# Patient Record
Sex: Female | Born: 1975 | Race: Black or African American | Hispanic: No | Marital: Married | Smoking: Current every day smoker
Health system: Southern US, Community
[De-identification: ages and names within clinical notes are randomized; demographics above are authoritative.]

## PROBLEM LIST (undated history)

## (undated) DIAGNOSIS — G43909 Migraine, unspecified, not intractable, without status migrainosus: Secondary | ICD-10-CM

## (undated) DIAGNOSIS — F419 Anxiety disorder, unspecified: Secondary | ICD-10-CM

## (undated) DIAGNOSIS — J40 Bronchitis, not specified as acute or chronic: Secondary | ICD-10-CM

## (undated) DIAGNOSIS — L309 Dermatitis, unspecified: Secondary | ICD-10-CM

## (undated) HISTORY — PX: BREAST REDUCTION SURGERY: SHX8

---

## 2010-09-01 ENCOUNTER — Emergency Department (HOSPITAL_BASED_OUTPATIENT_CLINIC_OR_DEPARTMENT_OTHER)
Admission: EM | Admit: 2010-09-01 | Discharge: 2010-09-01 | Disposition: A | Discharge status: Home / Self Care | Payer: Medicaid Other | Attending: Emergency Medicine | Admitting: Emergency Medicine

## 2010-09-01 ENCOUNTER — Emergency Department (INDEPENDENT_AMBULATORY_CARE_PROVIDER_SITE_OTHER): Payer: Medicaid Other

## 2010-09-01 DIAGNOSIS — M79609 Pain in unspecified limb: Secondary | ICD-10-CM

## 2010-09-01 DIAGNOSIS — M25469 Effusion, unspecified knee: Secondary | ICD-10-CM

## 2010-09-01 DIAGNOSIS — M7989 Other specified soft tissue disorders: Secondary | ICD-10-CM | POA: Insufficient documentation

## 2010-09-01 DIAGNOSIS — M25569 Pain in unspecified knee: Secondary | ICD-10-CM

## 2011-01-12 ENCOUNTER — Ambulatory Visit (HOSPITAL_BASED_OUTPATIENT_CLINIC_OR_DEPARTMENT_OTHER)
Admission: RE | Admit: 2011-01-12 | Discharge: 2011-01-12 | Disposition: A | Discharge status: Home / Self Care | Payer: Self-pay | Source: Ambulatory Visit | Attending: Emergency Medicine | Admitting: Emergency Medicine

## 2011-01-12 ENCOUNTER — Emergency Department (HOSPITAL_BASED_OUTPATIENT_CLINIC_OR_DEPARTMENT_OTHER)
Admission: EM | Admit: 2011-01-12 | Discharge: 2011-01-12 | Disposition: A | Discharge status: Home / Self Care | Payer: Self-pay | Attending: Emergency Medicine | Admitting: Emergency Medicine

## 2011-01-12 ENCOUNTER — Other Ambulatory Visit: Payer: Self-pay

## 2011-01-12 DIAGNOSIS — R079 Chest pain, unspecified: Secondary | ICD-10-CM | POA: Insufficient documentation

## 2011-01-12 DIAGNOSIS — R61 Generalized hyperhidrosis: Secondary | ICD-10-CM | POA: Insufficient documentation

## 2011-01-12 DIAGNOSIS — J40 Bronchitis, not specified as acute or chronic: Secondary | ICD-10-CM | POA: Insufficient documentation

## 2011-01-12 DIAGNOSIS — R05 Cough: Secondary | ICD-10-CM | POA: Insufficient documentation

## 2011-01-12 DIAGNOSIS — R059 Cough, unspecified: Secondary | ICD-10-CM | POA: Insufficient documentation

## 2011-01-12 DIAGNOSIS — K219 Gastro-esophageal reflux disease without esophagitis: Secondary | ICD-10-CM | POA: Insufficient documentation

## 2011-01-12 LAB — COMPREHENSIVE METABOLIC PANEL
ALT: 16 U/L (ref 0–35)
AST: 18 U/L (ref 0–37)
Albumin: 3.5 g/dL (ref 3.5–5.2)
Alkaline Phosphatase: 89 U/L (ref 39–117)
BUN: 14 mg/dL (ref 6–23)
CO2: 22 mEq/L (ref 19–32)
Calcium: 9.3 mg/dL (ref 8.4–10.5)
Chloride: 104 mEq/L (ref 96–112)
Creatinine, Ser: 0.9 mg/dL (ref 0.50–1.10)
GFR calc Af Amer: >60 mL/min (ref >60)
GFR calc non Af Amer: >60 mL/min (ref >60)
Glucose, Bld: 90 mg/dL (ref 70–99)
Potassium: 4.7 mEq/L (ref 3.5–5.1)
Sodium: 138 mEq/L (ref 135–145)
Total Bilirubin: 0.3 mg/dL (ref 0.3–1.2)
Total Protein: 7.8 g/dL (ref 6.0–8.3)

## 2011-01-12 LAB — CBC
HCT: 32.3 % — ABNORMAL LOW (ref 36.0–46.0)
Hemoglobin: 10.3 g/dL — ABNORMAL LOW (ref 12.0–15.0)
MCH: 25.1 pg — ABNORMAL LOW (ref 26.0–34.0)
MCHC: 31.9 g/dL (ref 30.0–36.0)
MCV: 78.6 fL (ref 78.0–100.0)
Platelets: 240 10*3/uL (ref 150–400)
RBC: 4.11 MIL/uL (ref 3.87–5.11)
RDW: 15.1 % (ref 11.5–15.5)
WBC: 4.2 10*3/uL (ref 4.0–10.5)

## 2011-01-12 LAB — AMYLASE: Amylase: 84 U/L (ref 0–105)

## 2011-01-12 LAB — LIPASE, BLOOD: Lipase: 32 U/L (ref 11–59)

## 2011-01-12 LAB — TROPONIN I: Troponin I: <0.3 ng/mL (ref <0.30)

## 2011-01-12 MED ORDER — KETOROLAC TROMETHAMINE 30 MG/ML IJ SOLN
30.0000 mg | Freq: Once | INTRAMUSCULAR | Status: AC
  Administered 2011-01-12: 30 mg via INTRAVENOUS
  Filled 2011-01-12: qty 1

## 2011-01-12 MED FILL — Alum & Mag Hydroxide-Simethicone Susp 200-200-20 MG/5ML: ORAL | Qty: 30 | Status: AC

## 2011-01-12 NOTE — ED Notes (Signed)
Pt. Reports she is feeling better with no resp. Distress noted.

## 2011-01-13 MED FILL — Alum & Mag Hydroxide-Simethicone Susp 200-200-20 MG/5ML: ORAL | Qty: 30 | Status: CN

## 2011-02-04 NOTE — ED Provider Notes (Signed)
History     CSN: [id number] Arrival date & time: 01/12/2011 11:44 AM  No chief complaint on file.   (Consider location/radiation/quality/duration/timing/severity/associated sxs/prior treatment) Patient is a 35 y.o. female presenting with chest pain. The history is provided by the patient. No language interpreter was used.  Chest Pain The chest pain began 6 - 12 hours ago. Chest pain occurs constantly. The chest pain is unchanged. The pain is associated with breathing and eating. At its most intense, the pain is at 10/10. The pain is currently at 7/10. The quality of the pain is described as burning and sharp. The pain radiates to the epigastrium. Chest pain is worsened by certain positions, exertion and eating. She tried nothing for the symptoms. Risk factors include no known risk factors.  Her family medical history is significant for CAD in family and diabetes in family.     No past medical history on file.  No past surgical history on file.  No family history on file.  History  Substance Use Topics  . Smoking status: Not on file  . Smokeless tobacco: Not on file  . Alcohol Use: Not on file    OB History    No data available      Review of Systems  Constitutional: Negative.   HENT: Negative.   Eyes: Negative.   Respiratory: Negative.   Cardiovascular: Positive for chest pain.  Gastrointestinal: Negative.   Genitourinary: Negative.   Musculoskeletal: Negative.   Skin: Negative.   Neurological: Negative.   Hematological: Negative.   Psychiatric/Behavioral: Negative.   All other systems reviewed and are negative.    Allergies  Review of patient's allergies indicates not on file.  Home Medications  No current outpatient prescriptions on file.  BP 116/69  Pulse 80  SpO2 99%  Physical Exam  Nursing note and vitals reviewed. Constitutional: She is oriented to person, place, and time. She appears well-developed and well-nourished. No distress.  HENT:  Head:  Normocephalic and atraumatic.  Eyes: Conjunctivae and EOM are normal. Pupils are equal, round, and reactive to light.  Neck: Normal range of motion.  Cardiovascular: Normal rate, regular rhythm, normal heart sounds and intact distal pulses.  Exam reveals no gallop and no friction rub.   No murmur heard. Pulmonary/Chest: Effort normal and breath sounds normal. No respiratory distress. She has no wheezes. She has no rales.  Abdominal: Soft. Bowel sounds are normal. She exhibits no distension. There is no tenderness. There is no rebound and no guarding.  Musculoskeletal: Normal range of motion. She exhibits no edema.  Neurological: She is alert and oriented to person, place, and time. No cranial nerve deficit. She exhibits normal muscle tone. Coordination normal.  Skin: Skin is warm.  Psychiatric: She has a normal mood and affect.    ED Course  Procedures (including critical care time)   Date: 01/12/2011  Rate: 88  Rhythm: normal sinus rhythm  QRS Axis: normal  Intervals: normal  ST/T Wave abnormalities: normal  Conduction Disutrbances:none  Narrative Interpretation: Normal EKG  Old EKG Reviewed: none available   Labs Reviewed  CBC - Abnormal; Notable for the following:    Hemoglobin 10.3 (*)    HCT 32.3 (*)    MCH 25.1 (*)    All other components within normal limits  AMYLASE  COMPREHENSIVE METABOLIC PANEL  LIPASE, BLOOD  TROPONIN I   No results found.   1. Chest pain, unspecified       MDM  Patient was evaluated for her chest pain.  She was given in a medically stable and in no acute distress. Patient was given toward all as well as a GI cocktail. With this the patient felt much better.  Lipase liver function troponin chest x-ray and EKG were all within normal limits. Patient did have a slight anemia. She does report having heavy menstrual periods. Her symptoms resolved in spite of this. Patient was told that she should followup with her primary care provider. She was  discharged home with Pepcid and told to return for other emergent concerns. Patient was comfortable the plan at discharge was discharged home in good condition.        Cyndra Numbers, MD 02/04/11 [phone]

## 2011-09-29 ENCOUNTER — Emergency Department (HOSPITAL_COMMUNITY)
Admission: EM | Admit: 2011-09-29 | Discharge: 2011-09-29 | Disposition: A | Discharge status: Home / Self Care | Payer: Medicaid Other | Attending: Emergency Medicine | Admitting: Emergency Medicine

## 2011-09-29 ENCOUNTER — Encounter (HOSPITAL_COMMUNITY): Payer: Self-pay | Admitting: Emergency Medicine

## 2011-09-29 DIAGNOSIS — G43909 Migraine, unspecified, not intractable, without status migrainosus: Secondary | ICD-10-CM

## 2011-09-29 HISTORY — DX: Migraine, unspecified, not intractable, without status migrainosus: G43.909

## 2011-09-29 MED ORDER — KETOROLAC TROMETHAMINE 30 MG/ML IJ SOLN
30.0000 mg | Freq: Once | INTRAMUSCULAR | Status: AC
  Administered 2011-09-29: 30 mg via INTRAVENOUS
  Filled 2011-09-29: qty 1

## 2011-09-29 MED ORDER — METOCLOPRAMIDE HCL 5 MG/ML IJ SOLN
10.0000 mg | Freq: Once | INTRAMUSCULAR | Status: AC
  Administered 2011-09-29: 10 mg via INTRAVENOUS
  Filled 2011-09-29: qty 2

## 2011-09-29 MED ORDER — SODIUM CHLORIDE 0.9 % IV BOLUS (SEPSIS)
1000.0000 mL | Freq: Once | INTRAVENOUS | Status: AC
  Administered 2011-09-29: 1000 mL via INTRAVENOUS

## 2011-09-29 MED ORDER — KETOROLAC TROMETHAMINE 10 MG PO TABS: 10.0000 mg | ORAL_TABLET | Freq: Four times a day (QID) | ORAL | Status: AC | PRN

## 2011-09-29 MED ORDER — DIPHENHYDRAMINE HCL 50 MG/ML IJ SOLN
25.0000 mg | Freq: Once | INTRAMUSCULAR | Status: AC
  Administered 2011-09-29: 25 mg via INTRAVENOUS
  Filled 2011-09-29: qty 1

## 2011-09-29 NOTE — ED Notes (Signed)
Pt claimed that she feels much better.

## 2011-09-29 NOTE — ED Notes (Signed)
Onset of migraine with history 3 days ago with light sensitivity pain 10/10 throbbing. States developed bilateral hand numbness and nausea after headache.  Today while at work light sensitivity and felt like walls were closing in.   Ax4 clear speech answering and following commands appropriate.

## 2011-09-29 NOTE — Discharge Instructions (Signed)
Return here as needed for any worsening in your condition. Increase your fluid intake. Follow up with your doctor for a recheck.

## 2011-09-29 NOTE — ED Provider Notes (Signed)
History     CSN: [id number]  Arrival date & time 09/29/11  1104   First MD Initiated Contact with Patient 09/29/11 1151      Chief Complaint  Patient presents with  . Migraine    (Consider location/radiation/quality/duration/timing/severity/associated sxs/prior treatment) HPI Patient presents emergency Dept. with a three-day history of migraine headache.  Patient states light makes her headache, worse.  Patient states that she has a migraine history, but has not had one in about a year.  Patient denies any visual changes, vomiting, chest pain, shortness of breath, fever, or weakness.  Patient, states that she tried ibuprofen without relief. Past Medical History  Diagnosis Date  . Migraine     Past Surgical History  Procedure Date  . Breast reduction surgery     No family history on file.  History  Substance Use Topics  . Smoking status: Current Everyday Smoker  . Smokeless tobacco: Not on file  . Alcohol Use: Yes    OB History    Grav Para Term Preterm Abortions TAB SAB Ect Mult Living                  Review of Systems All other systems negative except as documented in the HPI. All pertinent positives and negatives as reviewed in the HPI.  Allergies  Other  Home Medications   Current Outpatient Rx  Name Route Sig Dispense Refill  . ACETAMINOPHEN 500 MG PO TABS Oral Take 1,000 mg by mouth every 6 (six) hours as needed. For pain    . BUSPIRONE HCL 15 MG PO TABS Oral Take 7.5 mg by mouth 2 (two) times daily.    . CYCLOBENZAPRINE HCL 10 MG PO TABS Oral Take 10 mg by mouth at bedtime.    . IBUPROFEN 200 MG PO TABS Oral Take 400 mg by mouth every 6 (six) hours as needed. For pain.    Marland Kitchen NABUMETONE 750 MG PO TABS Oral Take 750 mg by mouth 2 (two) times daily as needed. For inflammation    . TRAMADOL HCL 50 MG PO TABS Oral Take 50 mg by mouth every 6 (six) hours as needed. For pain.      BP 121/82  Pulse 84  Temp(Src) 98.4 F (36.9 C) (Oral)  Resp 16  SpO2  100%  Physical Exam  Nursing note and vitals reviewed. Constitutional: She is oriented to person, place, and time. She appears well-developed and well-nourished.  HENT:  Head: Normocephalic and atraumatic.  Mouth/Throat: Oropharynx is clear and moist.  Eyes: EOM are normal. Pupils are equal, round, and reactive to light.  Cardiovascular: Normal rate, regular rhythm and normal heart sounds.   Pulmonary/Chest: Effort normal and breath sounds normal.  Neurological: She is alert and oriented to person, place, and time. She has normal strength. No sensory deficit. Coordination and gait normal. GCS eye subscore is 4. GCS verbal subscore is 5. GCS motor subscore is 6.  Skin: Skin is warm and dry.    ED Course  Procedures (including critical care time)  Patient has no neurological deficits noted on exam.  Patient will be given IV fluids and a migraine cocktail, consisting of Toradol, Reglan, and Benadryl.  Patient be reevaluated.  2:01 PM recheck the patient and she is completely resolved of her headache at this time MDM          Carlyle Dolly, PA-C 09/29/11 1401

## 2011-09-29 NOTE — ED Provider Notes (Signed)
Medical screening examination/treatment/procedure(s) were performed by non-physician practitioner and as supervising physician I was immediately available for consultation/collaboration.   Laray Anger, DO 09/29/11 1531

## 2011-10-11 ENCOUNTER — Encounter (HOSPITAL_COMMUNITY): Payer: Self-pay

## 2011-10-11 ENCOUNTER — Emergency Department (HOSPITAL_COMMUNITY): Payer: Medicaid Other

## 2011-10-11 ENCOUNTER — Emergency Department (HOSPITAL_COMMUNITY)
Admission: EM | Admit: 2011-10-11 | Discharge: 2011-10-11 | Disposition: A | Discharge status: Home / Self Care | Payer: Medicaid Other | Attending: Emergency Medicine | Admitting: Emergency Medicine

## 2011-10-11 DIAGNOSIS — F17200 Nicotine dependence, unspecified, uncomplicated: Secondary | ICD-10-CM | POA: Insufficient documentation

## 2011-10-11 DIAGNOSIS — IMO0002 Reserved for concepts with insufficient information to code with codable children: Secondary | ICD-10-CM | POA: Insufficient documentation

## 2011-10-11 DIAGNOSIS — Y92009 Unspecified place in unspecified non-institutional (private) residence as the place of occurrence of the external cause: Secondary | ICD-10-CM | POA: Insufficient documentation

## 2011-10-11 DIAGNOSIS — S6990XA Unspecified injury of unspecified wrist, hand and finger(s), initial encounter: Secondary | ICD-10-CM | POA: Insufficient documentation

## 2011-10-11 DIAGNOSIS — Z79899 Other long term (current) drug therapy: Secondary | ICD-10-CM | POA: Insufficient documentation

## 2011-10-11 DIAGNOSIS — S6991XA Unspecified injury of right wrist, hand and finger(s), initial encounter: Secondary | ICD-10-CM

## 2011-10-11 MED ORDER — HYDROCODONE-ACETAMINOPHEN 5-325 MG PO TABS
1.0000 | ORAL_TABLET | Freq: Once | ORAL | Status: AC
  Administered 2011-10-11: 1 via ORAL
  Filled 2011-10-11: qty 1

## 2011-10-11 NOTE — ED Provider Notes (Signed)
History     CSN: [id number]  Arrival date & time 10/11/11  4098   First MD Initiated Contact with Patient 10/11/11 [phone]      Chief Complaint  Patient presents with  . Hand Pain    (Consider location/radiation/quality/duration/timing/severity/associated sxs/prior treatment) The history is provided by the patient.   36 year old female with a history of only migraine headaches is right-hand dominant presents the emergency department with a chief complaint of persistent right hand pain after she hit her hand on a door at home 2 days ago. There is associated swelling and decreased range of motion as well as some color change. She denies any numbness to the hand. Pain worse with attempted movement, palpation of the hands. No alleviating factors. Has taken Relafen without relief.  Past Medical History  Diagnosis Date  . Migraine     Past Surgical History  Procedure Date  . Breast reduction surgery     No family history on file.  History  Substance Use Topics  . Smoking status: Current Everyday Smoker  . Smokeless tobacco: Not on file  . Alcohol Use: Yes     Review of Systems  Musculoskeletal:       See HPI  Skin: Positive for color change. Negative for wound.  Neurological: Positive for weakness. Negative for numbness.    Allergies  Other  Home Medications   Current Outpatient Rx  Name Route Sig Dispense Refill  . ACETAMINOPHEN 500 MG PO TABS Oral Take 1,000 mg by mouth every 6 (six) hours as needed. For pain    . BUSPIRONE HCL 15 MG PO TABS Oral Take 7.5 mg by mouth 2 (two) times daily.    . CYCLOBENZAPRINE HCL 10 MG PO TABS Oral Take 10 mg by mouth at bedtime.    . IBUPROFEN 200 MG PO TABS Oral Take 400 mg by mouth every 6 (six) hours as needed. For pain.    Marland Kitchen KETOROLAC TROMETHAMINE 10 MG PO TABS Oral Take 10 mg by mouth every 6 (six) hours as needed. For pain.    Marland Kitchen NABUMETONE 750 MG PO TABS Oral Take 750 mg by mouth 2 (two) times daily as needed. For inflammation     . TRAMADOL HCL 50 MG PO TABS Oral Take 50 mg by mouth every 6 (six) hours as needed. For pain.      BP 125/81  Pulse 80  Temp 98.2 F (36.8 C) (Oral)  Resp 14  SpO2 100%  LMP 10/04/2011  Physical Exam  Nursing note reviewed. Constitutional: She appears well-developed and well-nourished.       Vital signs are reviewed and are normal. unComfortable-appearing  HENT:  Head: Normocephalic.  Eyes: Conjunctivae are normal.  Neck: Neck supple.  Cardiovascular: Normal rate.   Pulmonary/Chest: No respiratory distress.  Musculoskeletal: She exhibits edema and tenderness.       Right wrist: Normal.       Right hand: She exhibits decreased range of motion, tenderness, bony tenderness and deformity. She exhibits normal capillary refill and no laceration. normal sensation noted. Decreased strength noted. She exhibits finger abduction. She exhibits no thumb/finger opposition and no wrist extension trouble.       To the right hand, there is pain to palpation over the second digit PIP joint, third digit MCP joint and PIP joint, fourth digit MCP joint, and over the entire body of the third and fourth metacarpals. Due to pain, patient is not able to flex the digits for evaluation of alignment. Distal NVI.  Neurological: She is alert.  Skin: Skin is warm and dry.    ED Course  Procedures (including critical care time)  Labs Reviewed - No data to display Dg Hand Complete Right  10/11/2011  *RADIOLOGY REPORT*  Clinical Data: Pain and swelling about the third metacarpal since an injury 3 days ago.  RIGHT HAND - COMPLETE 3+ VIEW  Comparison: None.  Findings: Soft tissues appear swollen on the lateral view.  No fracture or dislocation.  IMPRESSION: Soft tissue swelling without underlying bony or joint abnormality.  Original Report Authenticated By: Bernadene Bell. D'ALESSIO, M.D.     Dx 1: Right hand injury   MDM  Hand injury. Extremity is imaged, I personally reviewed the images, reviewed the  radiologist's interpretation. No fracture. Results discussed with patient. She will apply ice and continue taking NSAID for pain.        Shaaron Adler, PA-C 10/11/11 1001

## 2011-10-11 NOTE — ED Notes (Signed)
Pt states she hit right hand on door at home on Saturday and has had pain since.  Pt has taken ibuprofen with no relief.  Edema noted to right hand.

## 2011-10-11 NOTE — Discharge Instructions (Signed)
As discussed, there were no broken bones seen in your hand on the x-ray. Apply ice to your injured areas for 15-20 minutes three times a day for the next several days to help reduce swelling and inflammation. You should take ibuprofen or your Relafen as you have been doing to reduce inflammation. Take your tramadol as needed for more severe pain. If your pain does not improve as we discussed, call your primary Dr. to see if they are comfortable following up on this issue. If not, the phone number for the hand doctor is listed above.     Hand Injuries Minor fractures, sprains, bruises and burns of the hand are often managed in much the same way:  Elevation of your hand above the level of your heart for a few days after the injury, until the pain and swelling improve.   The use of hand dressings and splints to reduce motion, relieve painand prevent reinjury. The dressing and splint should not be removed without the caregiver's approval.   Application of ice packs for 20 to 30 minutes every few hours for 2 to 3 days to reduce pain and swelling due to fractures, sprains, and deep bruises.   The use of medicine to reduce pain and inflammation.  Early motion exercises are sometimes needed to reduce joint stiffness after a hand injury. However, your hand should not be used for any activities that increase pain. Document Released: 05/20/2004 Document Revised: 04/01/2011 Document Reviewed: 07/12/2008 [facility] Patient Information 2012 [place], [place].

## 2011-10-11 NOTE — ED Notes (Signed)
Waiting for D/C papers. 

## 2011-10-12 NOTE — ED Provider Notes (Signed)
Medical screening examination/treatment/procedure(s) were performed by non-physician practitioner and as supervising physician I was immediately available for consultation/collaboration.   Kianah Harries, MD 10/12/11 0821 

## 2011-10-19 ENCOUNTER — Ambulatory Visit: Payer: Medicaid Other | Attending: Orthopedic Surgery | Admitting: Occupational Therapy

## 2011-10-19 DIAGNOSIS — IMO0001 Reserved for inherently not codable concepts without codable children: Secondary | ICD-10-CM | POA: Insufficient documentation

## 2011-10-19 DIAGNOSIS — M25549 Pain in joints of unspecified hand: Secondary | ICD-10-CM | POA: Insufficient documentation

## 2011-10-19 DIAGNOSIS — R609 Edema, unspecified: Secondary | ICD-10-CM | POA: Insufficient documentation

## 2012-09-15 ENCOUNTER — Encounter (HOSPITAL_COMMUNITY): Payer: Self-pay

## 2012-09-15 ENCOUNTER — Emergency Department (HOSPITAL_COMMUNITY)
Admission: EM | Admit: 2012-09-15 | Discharge: 2012-09-15 | Disposition: A | Discharge status: Home / Self Care | Payer: Medicaid Other | Attending: Emergency Medicine | Admitting: Emergency Medicine

## 2012-09-15 DIAGNOSIS — Z79899 Other long term (current) drug therapy: Secondary | ICD-10-CM | POA: Insufficient documentation

## 2012-09-15 DIAGNOSIS — R059 Cough, unspecified: Secondary | ICD-10-CM | POA: Insufficient documentation

## 2012-09-15 DIAGNOSIS — F411 Generalized anxiety disorder: Secondary | ICD-10-CM | POA: Insufficient documentation

## 2012-09-15 DIAGNOSIS — R1310 Dysphagia, unspecified: Secondary | ICD-10-CM | POA: Insufficient documentation

## 2012-09-15 DIAGNOSIS — F17200 Nicotine dependence, unspecified, uncomplicated: Secondary | ICD-10-CM | POA: Insufficient documentation

## 2012-09-15 DIAGNOSIS — R05 Cough: Secondary | ICD-10-CM | POA: Insufficient documentation

## 2012-09-15 DIAGNOSIS — J029 Acute pharyngitis, unspecified: Secondary | ICD-10-CM | POA: Insufficient documentation

## 2012-09-15 DIAGNOSIS — Z8679 Personal history of other diseases of the circulatory system: Secondary | ICD-10-CM | POA: Insufficient documentation

## 2012-09-15 HISTORY — DX: Anxiety disorder, unspecified: F41.9

## 2012-09-15 LAB — RAPID STREP SCREEN (MED CTR MEBANE ONLY): Streptococcus, Group A Screen (Direct): NEGATIVE

## 2012-09-15 MED ORDER — TRAMADOL HCL 50 MG PO TABS: 50.0000 mg | ORAL_TABLET | Freq: Four times a day (QID) | ORAL | Status: DC | PRN

## 2012-09-15 MED ORDER — DEXAMETHASONE SODIUM PHOSPHATE 10 MG/ML IJ SOLN
10.0000 mg | Freq: Once | INTRAMUSCULAR | Status: AC
  Administered 2012-09-15: 10 mg via INTRAMUSCULAR
  Filled 2012-09-15: qty 1

## 2012-09-15 MED ORDER — LIDOCAINE VISCOUS 2 % MT SOLN
20.0000 mL | Freq: Once | OROMUCOSAL | Status: AC
  Administered 2012-09-15: 20 mL via OROMUCOSAL
  Filled 2012-09-15: qty 15

## 2012-09-15 MED ORDER — LIDOCAINE VISCOUS 2 % MT SOLN
15.0000 mL | OROMUCOSAL | Status: DC | PRN
Start: 2012-09-15 — End: 2013-04-06

## 2012-09-15 NOTE — ED Notes (Signed)
Pt reports sore throat x 2 days. Recently exposed to strep on Monday.

## 2012-09-15 NOTE — ED Provider Notes (Signed)
History    This chart was scribed for a non-physician practitioner, Wynetta Emery, working with Hurman Horn, MD by Frederik Pear, ED Scribe. This patient was seen in room TR09C/TR09C and the patient's care was started at 1820.   CSN: [id number]  Arrival date & time 09/15/12  1735   First MD Initiated Contact with Patient 09/15/12 1820      Chief Complaint  Patient presents with  . Sore Throat    (Consider location/radiation/quality/duration/timing/severity/associated sxs/prior treatment) The history is provided by the patient and medical records. No language interpreter was used.    HPI Comments: Pamela Lewis is a 37 y.o. female who presents to the Emergency Department complaining of sudden onset, constant, gradually worsening sore throat that is worse on the right than the left and began 2 days ago with associated intermittent cough and difficulty swallowing. She denies fever. She states that she works in a [employer] and was exposed to strep throat 4 days ago. She has treated the symptoms with alternating extra strength ibuprofen and tylenol since yesterday with no relief.  PCP is Dr. Lerry Liner.   Past Medical History  Diagnosis Date  . Migraine   . Anxiety     Past Surgical History  Procedure Laterality Date  . Breast reduction surgery      No family history on file.  History  Substance Use Topics  . Smoking status: Current Every Day Smoker  . Smokeless tobacco: Not on file  . Alcohol Use: Yes    OB History   Grav Para Term Preterm Abortions TAB SAB Ect Mult Living                  Review of Systems  Constitutional: Negative for fever.  HENT: Positive for sore throat and trouble swallowing.   Respiratory: Positive for cough.   Gastrointestinal: Negative for nausea, vomiting and diarrhea.  All other systems reviewed and are negative.   Allergies  Other  Home Medications   Current Outpatient Rx  Name  Route  Sig  Dispense  Refill  .  acetaminophen (TYLENOL) 500 MG tablet   Oral   Take 1,000 mg by mouth every 6 (six) hours as needed. For pain         . busPIRone (BUSPAR) 15 MG tablet   Oral   Take 7.5 mg by mouth daily.          Marland Kitchen ibuprofen (ADVIL,MOTRIN) 200 MG tablet   Oral   Take 400 mg by mouth every 6 (six) hours as needed for pain. For pain.           BP 126/82  Pulse 75  Temp(Src) 98.6 F (37 C) (Oral)  SpO2 98%  LMP 09/15/2012  Physical Exam  Nursing note and vitals reviewed. Constitutional: She is oriented to person, place, and time. She appears well-developed and well-nourished. No distress.  HENT:  Head: Normocephalic.  Mouth/Throat: Uvula is midline. No oropharyngeal exudate, posterior oropharyngeal edema, posterior oropharyngeal erythema or tonsillar abscesses.  Eyes: Conjunctivae and EOM are normal.  Cardiovascular: Normal rate.   Pulmonary/Chest: Effort normal. No stridor.  Abdominal: Soft. Bowel sounds are normal.  Musculoskeletal: Normal range of motion.  Lymphadenopathy:    She has no cervical adenopathy.  No anterior cervical lymphadenopathy.  Neurological: She is alert and oriented to person, place, and time.  Psychiatric: She has a normal mood and affect.    ED Course  Procedures (including critical care time)  DIAGNOSTIC STUDIES: Oxygen Saturation is  98% on room air, normal by my interpretation.    COORDINATION OF CARE:  19:16- Discussed planned course of treatment with the patient, including decadron and Xylocaine, who is agreeable at this time.  19:30- Medication Orders- dexamethasone (decadron) injection 10 mg- once, lidocaine (xylocaine) 2% viscous mouth solution 20 mL-once.  Results for orders placed during the hospital encounter of 09/15/12  RAPID STREP SCREEN      Result Value Range   Streptococcus, Group A Screen (Direct) NEGATIVE  NEGATIVE   Labs Reviewed  RAPID STREP SCREEN  CULTURE, GROUP A STREP   No results found.   1. Acute pharyngitis        MDM   Filed Vitals:   09/15/12 1814 09/15/12 2002  BP: 126/82 118/80  Pulse: 75 75  Temp: 98.6 F (37 C)   TempSrc: Oral   SpO2: 98% 100%     Pamela Lewis is a 37 y.o. female sore throat, physical exam is not consistent with strep. This is a viral pharyngitis. I have given the patient reassurance and given her symptomatic relief.   Medications  dexamethasone (DECADRON) injection 10 mg (10 mg Intramuscular Given 09/15/12 1956)  lidocaine (XYLOCAINE) 2 % viscous mouth solution 20 mL (20 mLs Mouth/Throat Given 09/15/12 1958)    The patient is hemodynamically stable, appropriate for, and amenable to, discharge at this time. Pt verbalized understanding and agrees with care plan. Outpatient follow-up and return precautions given.    Discharge Medication List as of 09/15/2012  7:21 PM    START taking these medications   Details  lidocaine (XYLOCAINE) 2 % solution Take 15 mLs by mouth every 3 (three) hours as needed for pain., Starting 09/15/2012, Until Discontinued, Print    traMADol (ULTRAM) 50 MG tablet Take 1 tablet (50 mg total) by mouth every 6 (six) hours as needed for pain., Starting 09/15/2012, Until Discontinued, Print        I personally performed the services described in this documentation, which was scribed in my presence. The recorded information has been reviewed and is accurate.          Wynetta Emery, PA-C 09/17/12 2134

## 2012-09-17 LAB — CULTURE, GROUP A STREP

## 2012-09-18 NOTE — ED Provider Notes (Signed)
Medical screening examination/treatment/procedure(s) were performed by non-physician practitioner and as supervising physician I was immediately available for consultation/collaboration.   Hurman Horn, MD 09/18/12 [phone]

## 2013-04-06 ENCOUNTER — Encounter (HOSPITAL_COMMUNITY): Admission: EM | Disposition: A | Discharge status: Home / Self Care | Payer: Self-pay | Source: Home / Self Care

## 2013-04-06 ENCOUNTER — Inpatient Hospital Stay (HOSPITAL_COMMUNITY)
Admission: EM | Admit: 2013-04-06 | Discharge: 2013-04-08 | DRG: 601 | Disposition: A | Discharge status: Home / Self Care | Payer: Medicaid Other | Attending: Surgery | Admitting: Surgery

## 2013-04-06 ENCOUNTER — Encounter (HOSPITAL_COMMUNITY): Payer: Self-pay | Admitting: Emergency Medicine

## 2013-04-06 ENCOUNTER — Emergency Department (HOSPITAL_COMMUNITY): Payer: Medicaid Other | Admitting: Certified Registered Nurse Anesthetist

## 2013-04-06 ENCOUNTER — Encounter (HOSPITAL_COMMUNITY): Payer: Medicaid Other | Admitting: Certified Registered Nurse Anesthetist

## 2013-04-06 DIAGNOSIS — Z803 Family history of malignant neoplasm of breast: Secondary | ICD-10-CM

## 2013-04-06 DIAGNOSIS — Z79899 Other long term (current) drug therapy: Secondary | ICD-10-CM

## 2013-04-06 DIAGNOSIS — N61 Mastitis without abscess: Principal | ICD-10-CM | POA: Diagnosis present

## 2013-04-06 DIAGNOSIS — F411 Generalized anxiety disorder: Secondary | ICD-10-CM | POA: Diagnosis present

## 2013-04-06 DIAGNOSIS — N611 Abscess of the breast and nipple: Secondary | ICD-10-CM

## 2013-04-06 DIAGNOSIS — F17200 Nicotine dependence, unspecified, uncomplicated: Secondary | ICD-10-CM | POA: Diagnosis present

## 2013-04-06 HISTORY — PX: IRRIGATION AND DEBRIDEMENT ABSCESS: SHX5252

## 2013-04-06 LAB — BASIC METABOLIC PANEL
BUN: 12 mg/dL (ref 6–23)
CO2: 24 mEq/L (ref 19–32)
Calcium: 8.7 mg/dL (ref 8.4–10.5)
Chloride: 101 mEq/L (ref 96–112)
Creatinine, Ser: 0.85 mg/dL (ref 0.50–1.10)
GFR calc Af Amer: >90 mL/min (ref >90)
GFR calc non Af Amer: 85 mL/min — ABNORMAL LOW (ref >90)
Glucose, Bld: 88 mg/dL (ref 70–99)
Potassium: 4.3 mEq/L (ref 3.5–5.1)
Sodium: 134 mEq/L — ABNORMAL LOW (ref 135–145)

## 2013-04-06 LAB — CBC WITH DIFFERENTIAL/PLATELET
Basophils Absolute: 0 10*3/uL (ref 0.0–0.1)
Basophils Relative: 0 % (ref 0–1)
Eosinophils Absolute: 0.3 10*3/uL (ref 0.0–0.7)
Eosinophils Relative: 3 % (ref 0–5)
HCT: 35.2 % — ABNORMAL LOW (ref 36.0–46.0)
Hemoglobin: 11.4 g/dL — ABNORMAL LOW (ref 12.0–15.0)
Lymphocytes Relative: 25 % (ref 12–46)
Lymphs Abs: 2 10*3/uL (ref 0.7–4.0)
MCH: 28 pg (ref 26.0–34.0)
MCHC: 32.4 g/dL (ref 30.0–36.0)
MCV: 86.5 fL (ref 78.0–100.0)
Monocytes Absolute: 0.6 10*3/uL (ref 0.1–1.0)
Monocytes Relative: 8 % (ref 3–12)
Neutro Abs: 5 10*3/uL (ref 1.7–7.7)
Neutrophils Relative %: 63 % (ref 43–77)
Platelets: 220 10*3/uL (ref 150–400)
RBC: 4.07 MIL/uL (ref 3.87–5.11)
RDW: 14.8 % (ref 11.5–15.5)
WBC: 7.9 10*3/uL (ref 4.0–10.5)

## 2013-04-06 SURGERY — IRRIGATION AND DEBRIDEMENT ABSCESS
Anesthesia: General | Site: Breast | Laterality: Right

## 2013-04-06 MED ORDER — BUSPIRONE HCL 15 MG PO TABS
7.5000 mg | ORAL_TABLET | Freq: Every day | ORAL | Status: DC
  Administered 2013-04-07 – 2013-04-08 (×2): 7.5 mg via ORAL
  Filled 2013-04-06 (×2): qty 1

## 2013-04-06 MED ORDER — HYDROMORPHONE HCL PF 1 MG/ML IJ SOLN: 1.0000 mg | Freq: Once | INTRAMUSCULAR | Status: DC

## 2013-04-06 MED ORDER — MIDAZOLAM HCL 5 MG/5ML IJ SOLN
INTRAMUSCULAR | Status: DC | PRN
  Administered 2013-04-06: 2 mg via INTRAVENOUS

## 2013-04-06 MED ORDER — 0.9 % SODIUM CHLORIDE (POUR BTL) OPTIME
TOPICAL | Status: DC | PRN
  Administered 2013-04-06: 1000 mL

## 2013-04-06 MED ORDER — OXYCODONE-ACETAMINOPHEN 5-325 MG PO TABS
1.0000 | ORAL_TABLET | ORAL | Status: DC | PRN
  Administered 2013-04-07 – 2013-04-08 (×5): 2 via ORAL
  Filled 2013-04-06 (×5): qty 2

## 2013-04-06 MED ORDER — OXYCODONE HCL 5 MG/5ML PO SOLN: 5.0000 mg | Freq: Once | ORAL | Status: DC | PRN

## 2013-04-06 MED ORDER — HYDROMORPHONE HCL PF 1 MG/ML IJ SOLN
1.0000 mg | INTRAMUSCULAR | Status: AC
  Administered 2013-04-06: 1 mg via INTRAVENOUS
  Filled 2013-04-06: qty 1

## 2013-04-06 MED ORDER — OXYCODONE HCL 5 MG PO TABS: 5.0000 mg | ORAL_TABLET | Freq: Once | ORAL | Status: DC | PRN

## 2013-04-06 MED ORDER — CEFAZOLIN SODIUM-DEXTROSE 2-3 GM-% IV SOLR
INTRAVENOUS | Status: DC | PRN
  Administered 2013-04-06: 2 g via INTRAVENOUS

## 2013-04-06 MED ORDER — LIDOCAINE HCL (CARDIAC) 20 MG/ML IV SOLN
INTRAVENOUS | Status: DC | PRN
  Administered 2013-04-06: 50 mg via INTRAVENOUS

## 2013-04-06 MED ORDER — ONDANSETRON HCL 4 MG/2ML IJ SOLN: 4.0000 mg | Freq: Four times a day (QID) | INTRAMUSCULAR | Status: DC | PRN

## 2013-04-06 MED ORDER — FENTANYL CITRATE 0.05 MG/ML IJ SOLN
INTRAMUSCULAR | Status: DC | PRN
  Administered 2013-04-06: 100 ug via INTRAVENOUS
  Administered 2013-04-06 (×3): 50 ug via INTRAVENOUS

## 2013-04-06 MED ORDER — POTASSIUM CHLORIDE IN NACL 20-0.9 MEQ/L-% IV SOLN
INTRAVENOUS | Status: DC
  Administered 2013-04-07: via INTRAVENOUS
  Filled 2013-04-06 (×4): qty 1000

## 2013-04-06 MED ORDER — MORPHINE SULFATE 2 MG/ML IJ SOLN: 2.0000 mg | INTRAMUSCULAR | Status: DC | PRN

## 2013-04-06 MED ORDER — LACTATED RINGERS IV SOLN
INTRAVENOUS | Status: DC | PRN
  Administered 2013-04-06: 21:00:00 via INTRAVENOUS

## 2013-04-06 MED ORDER — PROPOFOL 10 MG/ML IV BOLUS
INTRAVENOUS | Status: DC | PRN
  Administered 2013-04-06: 180 mg via INTRAVENOUS

## 2013-04-06 MED ORDER — HYDROMORPHONE HCL PF 1 MG/ML IJ SOLN
0.2500 mg | INTRAMUSCULAR | Status: DC | PRN
  Administered 2013-04-06 (×3): 0.5 mg via INTRAVENOUS

## 2013-04-06 MED ORDER — HYDROMORPHONE HCL PF 1 MG/ML IJ SOLN
INTRAMUSCULAR | Status: AC
  Filled 2013-04-06: qty 1

## 2013-04-06 MED ORDER — KETOROLAC TROMETHAMINE 15 MG/ML IJ SOLN
30.0000 mg | Freq: Four times a day (QID) | INTRAMUSCULAR | Status: DC
  Administered 2013-04-07 – 2013-04-08 (×6): 30 mg via INTRAVENOUS
  Filled 2013-04-06 (×11): qty 2

## 2013-04-06 MED ORDER — DEXTROSE 5 % IV SOLN
2.0000 g | Freq: Once | INTRAVENOUS | Status: DC
  Filled 2013-04-06: qty 20

## 2013-04-06 MED ORDER — SUCCINYLCHOLINE CHLORIDE 20 MG/ML IJ SOLN
INTRAMUSCULAR | Status: DC | PRN
  Administered 2013-04-06: 100 mg via INTRAVENOUS

## 2013-04-06 MED ORDER — ONDANSETRON HCL 4 MG PO TABS: 4.0000 mg | ORAL_TABLET | Freq: Four times a day (QID) | ORAL | Status: DC | PRN

## 2013-04-06 MED ORDER — ONDANSETRON HCL 4 MG/2ML IJ SOLN
INTRAMUSCULAR | Status: DC | PRN
  Administered 2013-04-06: 4 mg via INTRAVENOUS

## 2013-04-06 MED ORDER — CEFAZOLIN SODIUM 1-5 GM-% IV SOLN
1.0000 g | Freq: Three times a day (TID) | INTRAVENOUS | Status: DC
  Administered 2013-04-07 – 2013-04-08 (×4): 1 g via INTRAVENOUS
  Filled 2013-04-06 (×6): qty 50

## 2013-04-06 MED ORDER — ENOXAPARIN SODIUM 40 MG/0.4ML ~~LOC~~ SOLN
40.0000 mg | Freq: Every day | SUBCUTANEOUS | Status: DC
  Administered 2013-04-07: 40 mg via SUBCUTANEOUS
  Filled 2013-04-06 (×2): qty 0.4

## 2013-04-06 SURGICAL SUPPLY — 30 items
BLADE SURG 10 STRL SS (BLADE) ×2 IMPLANT
BLADE SURG ROTATE 9660 (MISCELLANEOUS) IMPLANT
CANISTER SUCTION 2500CC (MISCELLANEOUS) ×2 IMPLANT
COVER SURGICAL LIGHT HANDLE (MISCELLANEOUS) ×2 IMPLANT
DRAPE LAPAROTOMY TRNSV 102X78 (DRAPE) ×2 IMPLANT
DRAPE UTILITY 15X26 W/TAPE STR (DRAPE) ×4 IMPLANT
DRSG PAD ABDOMINAL 8X10 ST (GAUZE/BANDAGES/DRESSINGS) ×1 IMPLANT
ELECT CAUTERY BLADE 6.4 (BLADE) ×2 IMPLANT
ELECT REM PT RETURN 9FT ADLT (ELECTROSURGICAL) ×2
ELECTRODE REM PT RTRN 9FT ADLT (ELECTROSURGICAL) ×1 IMPLANT
GLOVE BIO SURGEON STRL SZ7 (GLOVE) ×2 IMPLANT
GLOVE BIOGEL PI IND STRL 7.5 (GLOVE) ×1 IMPLANT
GLOVE BIOGEL PI INDICATOR 7.5 (GLOVE) ×1
GOWN STRL NON-REIN LRG LVL3 (GOWN DISPOSABLE) ×4 IMPLANT
KIT BASIN OR (CUSTOM PROCEDURE TRAY) ×2 IMPLANT
KIT ROOM TURNOVER OR (KITS) ×2 IMPLANT
NS IRRIG 1000ML POUR BTL (IV SOLUTION) ×2 IMPLANT
PACK SURGICAL SETUP 50X90 (CUSTOM PROCEDURE TRAY) ×2 IMPLANT
PAD ARMBOARD 7.5X6 YLW CONV (MISCELLANEOUS) ×4 IMPLANT
PENCIL BUTTON HOLSTER BLD 10FT (ELECTRODE) ×2 IMPLANT
SPONGE GAUZE 4X4 12PLY (GAUZE/BANDAGES/DRESSINGS) ×2 IMPLANT
SPONGE LAP 18X18 X RAY DECT (DISPOSABLE) ×2 IMPLANT
SWAB COLLECTION DEVICE MRSA (MISCELLANEOUS) ×2 IMPLANT
SYR BULB IRRIGATION 50ML (SYRINGE) IMPLANT
TOWEL OR 17X24 6PK STRL BLUE (TOWEL DISPOSABLE) ×2 IMPLANT
TOWEL OR 17X26 10 PK STRL BLUE (TOWEL DISPOSABLE) ×2 IMPLANT
TUBE ANAEROBIC SPECIMEN COL (MISCELLANEOUS) ×2 IMPLANT
TUBE CONNECTING 12X1/4 (SUCTIONS) ×2 IMPLANT
WATER STERILE IRR 1000ML POUR (IV SOLUTION) ×1 IMPLANT
YANKAUER SUCT BULB TIP NO VENT (SUCTIONS) ×2 IMPLANT

## 2013-04-06 NOTE — Transfer of Care (Signed)
Immediate Anesthesia Transfer of Care Note  Patient: Pamela Lewis  Procedure(s) Performed: Procedure(s): IRRIGATION AND Drainage RIGHT BREAST ABSCESS (Right)  Patient Location: PACU  Anesthesia Type:General  Level of Consciousness: awake, alert , oriented and patient cooperative  Airway & Oxygen Therapy: Patient Spontanous Breathing and Patient connected to nasal cannula oxygen  Post-op Assessment: Report given to PACU RN and Post -op Vital signs reviewed and stable  Post vital signs: Reviewed and stable  Complications: No apparent anesthesia complications

## 2013-04-06 NOTE — Anesthesia Preprocedure Evaluation (Addendum)
Anesthesia Evaluation  Patient identified by MRN, date of birth, ID band Patient awake    Reviewed: Allergy & Precautions, H&P , NPO status , Patient's Chart, lab work & pertinent test results  Airway Mallampati: I TM Distance: >3 FB     Dental  (+) Teeth Intact and Dental Advisory Given   Pulmonary Current Smoker, former smoker,  breath sounds clear to auscultation        Cardiovascular Rhythm:Regular Rate:Normal     Neuro/Psych  Headaches, Anxiety    GI/Hepatic   Endo/Other    Renal/GU      Musculoskeletal   Abdominal   Peds  Hematology   Anesthesia Other Findings   Reproductive/Obstetrics                        Anesthesia Physical Anesthesia Plan  ASA: II and emergent  Anesthesia Plan: General   Post-op Pain Management:    Induction: Intravenous  Airway Management Planned: Oral ETT  Additional Equipment:   Intra-op Plan:   Post-operative Plan: Extubation in OR  Informed Consent:   Dental advisory given  Plan Discussed with: CRNA, Anesthesiologist and Surgeon  Anesthesia Plan Comments:         Anesthesia Quick Evaluation

## 2013-04-06 NOTE — ED Provider Notes (Signed)
CSN: [id number]     Arrival date & time 04/06/13  1731 History   First MD Initiated Contact with Patient 04/06/13 1828     Chief Complaint  Patient presents with  . Abscess   (Consider location/radiation/quality/duration/timing/severity/associated sxs/prior Treatment) HPI Patient presents emergency department with right breast abscess that has been ongoing since Sunday.  Patient, states she's had smaller ones in the past and normally going on around.  Patient, states, that the pain, and redness, increased.  Patient, states she's not had any nausea, vomiting, headache, blurred vision, weakness, numbness, dizziness, fever, chest pain, or shortness of breath.  Patient, states she did not take any medications prior to arrival other than ibuprofen for pain without relief. Past Medical History  Diagnosis Date  . Migraine   . Anxiety    Past Surgical History  Procedure Laterality Date  . Breast reduction surgery     History reviewed. No pertinent family history. History  Substance Use Topics  . Smoking status: Current Every Day Smoker  . Smokeless tobacco: Not on file  . Alcohol Use: Yes   OB History   Grav Para Term Preterm Abortions TAB SAB Ect Mult Living                 Review of Systems All other systems negative except as documented in the HPI. All pertinent positives and negatives as reviewed in the HPI. Allergies  Other  Home Medications   Current Outpatient Rx  Name  Route  Sig  Dispense  Refill  . acetaminophen (TYLENOL) 500 MG tablet   Oral   Take 1,000 mg by mouth every 6 (six) hours as needed. For pain         . busPIRone (BUSPAR) 15 MG tablet   Oral   Take 7.5 mg by mouth daily.          Marland Kitchen ibuprofen (ADVIL,MOTRIN) 800 MG tablet   Oral   Take 800 mg by mouth every 8 (eight) hours as needed for mild pain.         . Multiple Vitamin (MULTIVITAMIN WITH MINERALS) TABS tablet   Oral   Take 1 tablet by mouth daily.          BP 124/84  Pulse 104   Temp(Src) 99.1 F (37.3 C) (Oral)  Resp 18  Ht 5\' 3"  (1.6 m)  Wt 195 lb (88.451 kg)  BMI 34.55 kg/m2  SpO2 99% Physical Exam  Nursing note and vitals reviewed. Constitutional: She is oriented to person, place, and time. She appears well-developed and well-nourished.  Cardiovascular: Normal rate, regular rhythm and normal heart sounds.  Exam reveals no gallop and no friction rub.   No murmur heard. Pulmonary/Chest: Right breast exhibits skin change and tenderness.    Neurological: She is alert and oriented to person, place, and time.  Skin: Skin is warm and dry. There is erythema.    ED Course  Procedures (including critical care time) I spoke with Gen. surgery about the patient and they will be down to evaluate the patient.    Carlyle Dolly, PA-C 04/06/13 2020

## 2013-04-06 NOTE — Anesthesia Postprocedure Evaluation (Signed)
  Anesthesia Post-op Note  Patient: Pamela Lewis  Procedure(s) Performed: Procedure(s): IRRIGATION AND Drainage RIGHT BREAST ABSCESS (Right)  Patient Location: PACU  Anesthesia Type:General  Level of Consciousness: awake and alert   Airway and Oxygen Therapy: Patient Spontanous Breathing  Post-op Pain: mild  Post-op Assessment: Post-op Vital signs reviewed, Patient's Cardiovascular Status Stable and Respiratory Function Stable  Post-op Vital Signs: Reviewed  Filed Vitals:   04/06/13 2205  BP: 116/69  Pulse: 104  Temp:   Resp: 13    Complications: No apparent anesthesia complications

## 2013-04-06 NOTE — Op Note (Signed)
Pre-Op Diagnosis:  Right breast abscess Post-op Diagnosis:  Same Procedure:  Incision and drainage of right breast abscess Surgeon:  Manus Rudd K. Anesthesia: Gen. 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 She was evaluated in the emergency department and was noted to have a large abscess. It is difficult to determine on physical examination the exact size of the abscess. We recommended incision and drainage under anesthesia.  Description of procedure:  The patient was brought to the operating room and placed in a supine position on the operating room table. After an adequate level of general anesthesia was obtained, her right breast was prepped with ChloraPrep and draped in sterile fashion. A timeout was taken to ensure the proper patient proper procedure. With the patient asleep I can palpate a central fluctuant area measuring about 6 cm across. I incised the Center of this mass and we encountered some purulent fluid under pressure. This fluid was cultured and sent for microbiology. We excised some skin and subcutaneous tissue over the abscess measuring a total of 20 cm.  The abscess cavity does not extend very deep. It only measures about 1 cm deep. There are no other tunnels or fistulous tracts that I can visualize. We irrigated thoroughly. I used cautery for hemostasis. The wound is packed with a saline moistened 4 x 4 gauze. The patient was then extubated and brought to recovery in stable condition. All sponge, instrument, and needle counts are correct.  Wilmon Arms. Corliss Skains, MD, [facility] Surgery  General/ Trauma Surgery  04/06/2013 9:46 PM

## 2013-04-06 NOTE — ED Notes (Signed)
Pt in c/o abscess to her right breast since Sunday, history of same but they normally go away on there own, this one has not and pain and swelling have increased

## 2013-04-06 NOTE — ED Notes (Signed)
Dr. Margaree Mackintosh- general surgery.

## 2013-04-06 NOTE — Anesthesia Procedure Notes (Signed)
Procedure Name: Intubation Date/Time: 04/06/2013 9:14 PM Performed by: Julianne Rice Z Pre-anesthesia Checklist: Patient identified, Timeout performed, Emergency Drugs available, Suction available and Patient being monitored Patient Re-evaluated:Patient Re-evaluated prior to inductionOxygen Delivery Method: Circle system utilized Preoxygenation: Pre-oxygenation with 100% oxygen Intubation Type: IV induction Ventilation: Mask ventilation without difficulty Laryngoscope Size: Mac and 3 Grade View: Grade I Tube type: Oral Tube size: 7.5 mm Number of attempts: 1 Airway Equipment and Method: Stylet Placement Confirmation: ETT inserted through vocal cords under direct vision,  breath sounds checked- equal and bilateral and positive ETCO2 Secured at: 23 cm Tube secured with: Tape Dental Injury: Teeth and Oropharynx as per pre-operative assessment

## 2013-04-06 NOTE — H&P (Signed)
Pamela Lewis is an 37 y.o. female.   Chief Complaint: Right breast infection HPI: This is a 37 yo female that presents with five days of worsening swelling and tenderness in the right breast.  The patient has a long history of frequent superficial infections in both breasts.  She had bilateral breast reduction surgery in 2000-09-14.  Her sister died last year of breast cancer at the age of 46.  She has never had these infections drained formally, but always treated them herself.  There has never been a culture.  Past Medical History  Diagnosis Date  . Migraine   . Anxiety     Past Surgical History  Procedure Laterality Date  . Breast reduction surgery      FH - sister - died of breast cancer age 6 Another sibling with leukemia  Social History:  reports that she has been smoking.  She does not have any smokeless tobacco history on file. She reports that she drinks alcohol. She reports that she does not use illicit drugs. Works as a Sales executive  Allergies:  Allergies  Allergen Reactions  . Other Hives, Shortness Of Breath and Swelling    Seafood Fruits with skin.    Prior to Admission medications   Medication Sig Start Date End Date Taking? Authorizing Provider  acetaminophen (TYLENOL) 500 MG tablet Take 1,000 mg by mouth every 6 (six) hours as needed. For pain   Yes Historical Provider, MD  busPIRone (BUSPAR) 15 MG tablet Take 7.5 mg by mouth daily.    Yes Historical Provider, MD  ibuprofen (ADVIL,MOTRIN) 800 MG tablet Take 800 mg by mouth every 8 (eight) hours as needed for mild pain.   Yes Historical Provider, MD  Multiple Vitamin (MULTIVITAMIN WITH MINERALS) TABS tablet Take 1 tablet by mouth daily.   Yes Historical Provider, MD    No results found for this or any previous visit (from the past 48 hour(s)). No results found.  Review of Systems  Constitutional: Negative for weight loss.  HENT: Negative for ear discharge, ear pain, hearing loss and tinnitus.   Eyes:  Negative for blurred vision, double vision, photophobia and pain.  Respiratory: Negative for cough, sputum production and shortness of breath.   Cardiovascular: Negative for chest pain.  Gastrointestinal: Negative for nausea, vomiting and abdominal pain.  Genitourinary: Negative for dysuria, urgency, frequency and flank pain.  Musculoskeletal: Negative for back pain, falls, joint pain, myalgias and neck pain.  Neurological: Negative for dizziness, tingling, sensory change, focal weakness, loss of consciousness and headaches.  Endo/Heme/Allergies: Does not bruise/bleed easily.  Psychiatric/Behavioral: Negative for depression, memory loss and substance abuse. The patient is not nervous/anxious.     Blood pressure 124/84, pulse 104, temperature 99.1 F (37.3 C), temperature source Oral, resp. rate 18, height 5\' 3"  (1.6 m), weight 195 lb (88.451 kg), SpO2 99.00%. Physical Exam  WDWN in NAD HEENT:  EOMI, sclera anicteric Neck:  No masses, no thyromegaly Lungs:  CTA bilaterally; normal respiratory effort Breasts - healed incisions; lower half of central right breast - erythematous over about 10 cm area, tender, slight fluctuance; no sign of infection in left breast CV:  Regular rate and rhythm; no murmurs Abd:  +bowel sounds, soft, non-tender, no masses Ext:  Well-perfused; no edema Skin:  Warm, dry; no sign of jaundice  Assessment/Plan Right breast abscess   Incision and drainage of right breast abscess under anesthesia.  The surgical procedure has been discussed with the patient.  Potential risks, benefits, alternative treatments, and  expected outcomes have been explained.  All of the patient's questions at this time have been answered.  The likelihood of reaching the patient's treatment goal is good.  The patient understand the proposed surgical procedure and wishes to proceed.   Alferd Obryant K. 04/06/2013, 8:07 PM

## 2013-04-07 MED ORDER — MENTHOL 3 MG MT LOZG: 1.0000 | LOZENGE | OROMUCOSAL | Status: DC | PRN

## 2013-04-07 NOTE — ED Provider Notes (Signed)
Medical screening examination/treatment/procedure(s) were performed by non-physician practitioner and as supervising physician I was immediately available for consultation/collaboration.  Flint Melter, MD 04/07/13 [phone]

## 2013-04-07 NOTE — Progress Notes (Signed)
1 Day Post-Op   Assessment: s/p Procedure(s): IRRIGATION AND Drainage RIGHT BREAST ABSCESS Patient Active Problem List   Diagnosis Date Noted  . Breast abscess of female 04/06/2013    Improved  Plan: Plan for discharge tomorrow  Subjective: Feels better than yesterday, but still sore  Objective: Vital signs in last 24 hours: Temp:  [98.2 F (36.8 C)-99.2 F (37.3 C)] 98.3 F (36.8 C) (12/13 0535) Pulse Rate:  [80-119] 80 (12/13 0535) Resp:  [13-18] 16 (12/13 0535) BP: (94-132)/(57-84) 97/62 mmHg (12/13 0535) SpO2:  [97 %-100 %] 99 % (12/13 0535) Weight:  [195 lb (88.451 kg)] 195 lb (88.451 kg) (12/12 1736)   Intake/Output from previous day: 12/12 0701 - 12/13 0700 In: 1481.3 [P.O.:340; I.V.:1141.3] Out: 650 [Urine:650]  General appearance: alert, cooperative and no distress bilateral scleral ecchymosis, no visual impari  Incision: Clean   Lab Results:   Recent Labs  04/06/13 1924  WBC 7.9  HGB 11.4*  HCT 35.2*  PLT 220   BMET  Recent Labs  04/06/13 1924  NA 134*  K 4.3  CL 101  CO2 24  GLUCOSE 88  BUN 12  CREATININE 0.85  CALCIUM 8.7    MEDS, Scheduled . busPIRone  7.5 mg Oral Daily  .  ceFAZolin (ANCEF) IV  1 g Intravenous Q8H  . enoxaparin (LOVENOX) injection  40 mg Subcutaneous Daily  . HYDROmorphone      . HYDROmorphone      . ketorolac  30 mg Intravenous Q6H    Studies/Results: No results found.    LOS: 1 day     Currie Paris, MD, [facility] Surgery, [place] [phone]   04/07/2013 9:41 AM

## 2013-04-07 NOTE — Care Management Note (Signed)
UR complete    Pearl Berlinger,MSN,RN [phone] 

## 2013-04-08 MED ORDER — DOXYCYCLINE HYCLATE 100 MG PO TABS: 100.0000 mg | ORAL_TABLET | Freq: Two times a day (BID) | ORAL | Status: DC

## 2013-04-08 MED ORDER — FLUCONAZOLE 200 MG PO TABS: 200.0000 mg | ORAL_TABLET | Freq: Every day | ORAL | Status: DC

## 2013-04-08 MED ORDER — OXYCODONE-ACETAMINOPHEN 5-325 MG PO TABS: 1.0000 | ORAL_TABLET | ORAL | Status: DC | PRN

## 2013-04-08 NOTE — Progress Notes (Signed)
2 Days Post-Op   Assessment: s/p Procedure(s): IRRIGATION AND Drainage RIGHT BREAST ABSCESS Patient Active Problem List   Diagnosis Date Noted  . Breast abscess of female 04/06/2013    Improved and should be able to go home  Plan: Discharge Will give 10 days antibiotics, plan to see in office next week Subjective: Feels better and able to go home. Voices understanding of wound care as does husband  Objective: Vital signs in last 24 hours: Temp:  [97.7 F (36.5 C)-98.4 F (36.9 C)] 98.4 F (36.9 C) (12/14 0508) Pulse Rate:  [62-97] 84 (12/14 0508) Resp:  [16-18] 18 (12/14 0508) BP: (97-110)/(61-72) 110/66 mmHg (12/14 0508) SpO2:  [99 %-100 %] 100 % (12/14 0508)   Intake/Output from previous day: 12/13 0701 - 12/14 0700 In: 1332 [P.O.:1060; I.V.:272] Out: 300 [Urine:300]  General appearance: alert, cooperative and no distress Resp: clear to auscultation bilaterally  Incision: Clean  Lab Results:   Recent Labs  04/06/13 1924  WBC 7.9  HGB 11.4*  HCT 35.2*  PLT 220   BMET  Recent Labs  04/06/13 1924  NA 134*  K 4.3  CL 101  CO2 24  GLUCOSE 88  BUN 12  CREATININE 0.85  CALCIUM 8.7    MEDS, Scheduled . busPIRone  7.5 mg Oral Daily  .  ceFAZolin (ANCEF) IV  1 g Intravenous Q8H  . enoxaparin (LOVENOX) injection  40 mg Subcutaneous Daily  . ketorolac  30 mg Intravenous Q6H    Studies/Results: No results found.    LOS: 2 days     Currie Paris, MD, [facility] Surgery, [place] [phone]   04/08/2013 7:42 AM

## 2013-04-08 NOTE — Progress Notes (Signed)
Pt for discharge today, accomp by husband.  Rx for percocet given and explained.  All DC instructions given and explained.  Pt is to FU Dr. Corliss Skains in 1 week, # given to call if any questions, concerns, problems.  Husband knows how to change dressing and supplies given for home.

## 2013-04-09 ENCOUNTER — Telehealth (INDEPENDENT_AMBULATORY_CARE_PROVIDER_SITE_OTHER): Payer: Self-pay | Admitting: *Deleted

## 2013-04-09 NOTE — Discharge Summary (Signed)
  Physician Discharge Summary  Patient ID: Pamela Lewis MRN: [id number] DOB/AGE: March 11, 1976 37 y.o.  Admit date: 04/06/2013 Discharge date: 04/09/2013  Admitting Diagnosis: Right breast abscess  Discharge Diagnosis Patient Active Problem List   Diagnosis Date Noted  . Breast abscess of female 04/06/2013    Consultants none  Imaging: No results found.  Procedures Incision and drainage of right breast abscess(Dr. Corliss Skains)  Hospital Course:  Pamela Lewis is a 37 year old female with a history of multiple breast abscess who presented to [facility] with right breast tenderness for 5 days.  Workup showed a right breast abscess.  Patient was admitted and underwent procedure listed above.  Tolerated procedure well and was transferred to the floor.  Diet was advanced as tolerated.  On POD #1 she was given antibiotics, mobilized, dressing was changed and remained stable.  On POD #2, the patient was voiding well, tolerating diet, ambulating well, pain well controlled, vital signs stable, incisions c/d/i and felt stable for discharge home.  Patient will follow up in our office in 2 weeks and knows to call with questions or concerns.     Medication List         acetaminophen 500 MG tablet  Commonly known as:  TYLENOL  Take 1,000 mg by mouth every 6 (six) hours as needed. For pain     busPIRone 15 MG tablet  Commonly known as:  BUSPAR  Take 7.5 mg by mouth daily.     doxycycline 100 MG tablet  Commonly known as:  VIBRA-TABS  Take 1 tablet (100 mg total) by mouth 2 (two) times daily.     fluconazole 200 MG tablet  Commonly known as:  DIFLUCAN  Take 1 tablet (200 mg total) by mouth daily.     ibuprofen 800 MG tablet  Commonly known as:  ADVIL,MOTRIN  Take 800 mg by mouth every 8 (eight) hours as needed for mild pain.     multivitamin with minerals Tabs tablet  Take 1 tablet by mouth daily.     oxyCODONE-acetaminophen 5-325 MG per tablet  Commonly known as:  PERCOCET/ROXICET  Take  1-2 tablets by mouth every 4 (four) hours as needed for moderate pain.           SignedAshok Norris, [facility] [phone]  04/09/2013, 9:30 AM

## 2013-04-09 NOTE — Discharge Summary (Signed)
Agree with A&P of ER,NP. Patient seen yesterday, but did not have Epic access to Norris note.

## 2013-04-09 NOTE — Telephone Encounter (Signed)
Per Anette Riedel, I called and made sure pt was aware of her f/u appt 04/24/13 @ 3:45.. Advised pt to be here 30 mins before to complete paperwork.  Pt aware and appt confirmed.Marland Kitchenjkw

## 2013-04-10 ENCOUNTER — Encounter (HOSPITAL_COMMUNITY): Payer: Self-pay | Admitting: Surgery

## 2013-04-10 LAB — CULTURE, ROUTINE-ABSCESS: Gram Stain: NONE SEEN

## 2013-04-12 LAB — ANAEROBIC CULTURE

## 2013-04-24 ENCOUNTER — Ambulatory Visit (INDEPENDENT_AMBULATORY_CARE_PROVIDER_SITE_OTHER): Payer: Medicaid Other | Admitting: General Surgery

## 2013-04-24 ENCOUNTER — Encounter (INDEPENDENT_AMBULATORY_CARE_PROVIDER_SITE_OTHER): Payer: Self-pay

## 2013-04-24 VITALS — BP 128/86 | HR 72 | Temp 97.7°F | Resp 16 | Ht 63.0 in | Wt 228.8 lb

## 2013-04-24 DIAGNOSIS — N611 Abscess of the breast and nipple: Secondary | ICD-10-CM

## 2013-04-24 DIAGNOSIS — N61 Mastitis without abscess: Secondary | ICD-10-CM

## 2013-04-24 MED ORDER — TRAMADOL HCL 50 MG PO TABS: 50.0000 mg | ORAL_TABLET | Freq: Four times a day (QID) | ORAL | Status: DC | PRN

## 2013-04-24 NOTE — Patient Instructions (Signed)
Continue with Wet to dry dressing changes daily to twice daily, discontinue antibiotic ointment because its making the wound too wet.  You can wear 2 bra's for comfort as needed.  No further antibiotics needed.  This should take a few more weeks to a few months to heal over fully.  Follow up with [place] as needed.

## 2013-04-24 NOTE — Progress Notes (Signed)
Subjective: Pamela Lewis is a 37 y.o. female who presents today for follow up from I&D of right breast abscess.  She was discharged from the hospital on 04/09/13.  The patient is tolerating their diet well and is having no severe pain.  Bowel function is good.  Dressing changes going well but sill painful.  She notes it is significantly smaller than before.  Notes some yellowish drainage which soaks the dressing after applying antibiotic ointment.  Pt is returning to normal activity / work.  She asks for a few more pain pills to help with dressing changes.   Objective: Vital signs in last 24 hours: Reviewed   PE: General:  Alert, NAD, pleasant Breast: right lower breast 5cm x 2cm oval area erythema which is beefy red with no slough noted, no periwound induration or erythema, mildly ttp.   Assessment/Plan  1.  S/P Right breast abscess I&D: doing well dressing her wound daily without issues.  Continue QD to BID dressing changes without antibiotic ointment.  Bra support.  No further antibiotics needs because she no longer has cellulitis.  No specific follow up needed.  Monitor wounds daily.  Can see [place] as needed.  Encouraged her to allow the wound to stay open to the air when at home since it is very superficial.   2.  Post operative facial swelling and conjunctival hemorrhages to eyes - likely from straining around the time of surgery or after and IV fluids.  The patient had pictures of these.  I don't see a note from anesthesia or comments in the Op report noting any problems/allergic reactions.   These have resolved and no further recommendations for the patient needed.   Aris [place], PA-C 04/24/2013

## 2013-11-10 ENCOUNTER — Encounter (HOSPITAL_COMMUNITY): Payer: Self-pay | Admitting: Emergency Medicine

## 2013-11-10 ENCOUNTER — Emergency Department (HOSPITAL_COMMUNITY)
Admission: EM | Admit: 2013-11-10 | Discharge: 2013-11-10 | Disposition: A | Discharge status: Home / Self Care | Payer: Medicaid Other | Attending: Emergency Medicine | Admitting: Emergency Medicine

## 2013-11-10 DIAGNOSIS — Z8679 Personal history of other diseases of the circulatory system: Secondary | ICD-10-CM | POA: Insufficient documentation

## 2013-11-10 DIAGNOSIS — Z79899 Other long term (current) drug therapy: Secondary | ICD-10-CM | POA: Insufficient documentation

## 2013-11-10 DIAGNOSIS — F411 Generalized anxiety disorder: Secondary | ICD-10-CM | POA: Insufficient documentation

## 2013-11-10 DIAGNOSIS — L723 Sebaceous cyst: Secondary | ICD-10-CM | POA: Insufficient documentation

## 2013-11-10 DIAGNOSIS — F17200 Nicotine dependence, unspecified, uncomplicated: Secondary | ICD-10-CM | POA: Insufficient documentation

## 2013-11-10 DIAGNOSIS — L089 Local infection of the skin and subcutaneous tissue, unspecified: Secondary | ICD-10-CM

## 2013-11-10 MED ORDER — TRAMADOL HCL 50 MG PO TABS: 50.0000 mg | ORAL_TABLET | Freq: Four times a day (QID) | ORAL | Status: DC | PRN

## 2013-11-10 MED ORDER — SULFAMETHOXAZOLE-TRIMETHOPRIM 800-160 MG PO TABS: 1.0000 | ORAL_TABLET | Freq: Two times a day (BID) | ORAL | Status: DC

## 2013-11-10 NOTE — ED Notes (Signed)
Pt. Stated, I have this boil underneath my left arm.  It started as a little bump

## 2013-11-10 NOTE — Discharge Instructions (Signed)
Please read and follow all provided instructions.  Your diagnoses today include:  1. Infected sebaceous cyst     Tests performed today include:  Vital signs. See below for your results today.   Medications prescribed:   Bactrim (trimethoprim/sulfamethoxazole) - antibiotic  Only fill this antibiotic if you worsen or continue to have symptoms in 72 hours. If filled, take entire course of antibiotics as directed and follow-up with your doctor.  You have been prescribed an antibiotic medicine: take the entire course of medicine even if you are feeling better. Stopping early can cause the antibiotic not to work.   Tramadol - narcotic-like pain medication  DO NOT drive or perform any activities that require you to be awake and alert because this medicine can make you drowsy.   Take any prescribed medications only as directed.   Home care instructions:   Follow any educational materials contained in this packet  Follow-up instructions: Return to the Emergency Department in 48 hours for a recheck if your symptoms are not significantly improved.  Please follow-up with your primary care provider in the next 1 week for further evaluation of your symptoms.   Return instructions:  Return to the Emergency Department if you have:  Fever  Worsening symptoms  Worsening pain  Worsening swelling  Redness of the skin that moves away from the affected area, especially if it streaks away from the affected area   Any other emergent concerns  Your vital signs today were: BP 115/80   Pulse 86   Temp(Src) 98.3 F (36.8 C) (Oral)   Resp 17   SpO2 100%   LMP 10/28/2013 If your blood pressure (BP) was elevated above 135/85 this visit, please have this repeated by your doctor within one month. --------------

## 2013-11-10 NOTE — ED Provider Notes (Signed)
CSN: [id number]     Arrival date & time 11/10/13  4098 History   First MD Initiated Contact with Patient 11/10/13 [phone]     Chief Complaint  Patient presents with  . Abscess     (Consider location/radiation/quality/duration/timing/severity/associated sxs/prior Treatment) HPI Comments: Patient presents with complaint of left axillary abscess for the past 2 days. Area has gradually become more painful and swollen. Patient has a history of boils in the past. No treatments prior to arrival for this. No fevers, nausea, vomiting. No drainage. Course is constant. Palpation makes the pain worse. Nothing makes it better.  Patient is a 38 y.o. female presenting with abscess. The history is provided by the patient.  Abscess Associated symptoms: no fever, no nausea and no vomiting     Past Medical History  Diagnosis Date  . Migraine   . Anxiety    Past Surgical History  Procedure Laterality Date  . Breast reduction surgery    . Irrigation and debridement abscess Right 04/06/2013    Procedure: IRRIGATION AND Drainage RIGHT BREAST ABSCESS;  Surgeon: Wilmon Arms. Corliss Skains, MD;  Location: [facility] OR;  Service: General;  Laterality: Right;   No family history on file. History  Substance Use Topics  . Smoking status: Current Every Day Smoker  . Smokeless tobacco: Not on file  . Alcohol Use: Yes   OB History   Grav Para Term Preterm Abortions TAB SAB Ect Mult Living                 Review of Systems  Constitutional: Negative for fever.  Gastrointestinal: Negative for nausea and vomiting.  Skin: Negative for color change.       Positive for abscess.  Hematological: Negative for adenopathy.   Allergies  Other  Home Medications   Prior to Admission medications   Medication Sig Start Date End Date Taking? Authorizing Provider  acetaminophen (TYLENOL) 500 MG tablet Take 1,000 mg by mouth every 6 (six) hours as needed. For pain    Historical Provider, MD  busPIRone (BUSPAR) 15 MG tablet Take 7.5  mg by mouth daily.     Historical Provider, MD  fluconazole (DIFLUCAN) 200 MG tablet Take 1 tablet (200 mg total) by mouth daily. 04/08/13   Currie Paris, MD  ibuprofen (ADVIL,MOTRIN) 800 MG tablet Take 800 mg by mouth every 8 (eight) hours as needed for mild pain.    Historical Provider, MD  Multiple Vitamin (MULTIVITAMIN WITH MINERALS) TABS tablet Take 1 tablet by mouth daily.    Historical Provider, MD  traMADol (ULTRAM) 50 MG tablet Take 1-2 tablets (50-100 mg total) by mouth every 6 (six) hours as needed. 04/24/13   Megan Dort, PA-C   BP 115/80  Pulse 86  Temp(Src) 98.3 F (36.8 C) (Oral)  Resp 17  SpO2 100%  LMP 10/28/2013  Physical Exam  Nursing note and vitals reviewed. Constitutional: She appears well-developed and well-nourished.  HENT:  Head: Normocephalic and atraumatic.  Eyes: Conjunctivae are normal.  Neck: Normal range of motion. Neck supple.  Pulmonary/Chest: No respiratory distress.  Neurological: She is alert.  Skin: Skin is warm and dry.  1 cm x 2 cm area of induration left axilla, mild fluctuance, consistent with abscess. No overlying cellulitis noted.  Psychiatric: She has a normal mood and affect.    ED Course  Procedures (including critical care time) Labs Review Labs Reviewed - No data to display  Imaging Review No results found.   EKG Interpretation None  7:31 AM Patient seen and examined. Work-up initiated.    Vital signs reviewed and are as follows: Filed Vitals:   11/10/13 0658  BP: 115/80  Pulse: 86  Temp: 98.3 F (36.8 C)  Resp: 17   INCISION AND DRAINAGE Performed by: Providence LaniusGEIPLE,Bridgette Wolden S, Barrett PA-S2 Consent: Verbal consent obtained. Risks and benefits: risks, benefits and alternatives were discussed Type: abscess  Body area: L axilla  Anesthesia: local infiltration  Incision was made with a scalpel.  Local anesthetic: lidocaine 2% with epinephrine  Anesthetic total: 2 ml  Complexity: complex Blunt dissection  to break up loculations  Drainage: purulent  Drainage amount: moderate  Packing material: none  Patient tolerance: Patient tolerated the procedure well with no immediate complications.  7:47 AM Pt counseled on warm soaks. Abx prescribed. Pt instructed to wait 48-72 hrs and fill only with no improvement/worsening.   The patient was urged to return to the Emergency Department urgently with worsening pain, swelling, expanding erythema especially if it streaks away from the affected area, fever, or if they have any other concerns.   The patient was urged to return to the Emergency Department or go to their PCP in 48 hours for wound recheck if the area is not significantly improved.  Patient counseled on use of narcotic pain medications. Counseled not to combine these medications with others containing tylenol. Urged not to drink alcohol, drive, or perform any other activities that requires focus while taking these medications. The patient verbalizes understanding and agrees with the plan.  The patient verbalized understanding and stated agreement with this plan.    MDM   Final diagnoses:  Infected sebaceous cyst   Patient with skin abscess amenable to incision and drainage. Abscess is likely infected sebaceous cyst. No signs of cellulitis is surrounding skin. Will d/c to home. Abx given to use only if no improvement/worsening per patient request.      Renne CriglerJoshua Sakshi Sermons, PA-C 11/10/13 [phone]

## 2013-11-10 NOTE — ED Provider Notes (Signed)
Medical screening examination/treatment/procedure(s) were performed by non-physician practitioner and as supervising physician I was immediately available for consultation/collaboration.   Shanna CiscoMegan E Docherty, MD 11/10/13 [phone]

## 2015-11-18 ENCOUNTER — Other Ambulatory Visit: Payer: Self-pay | Admitting: Family Medicine

## 2015-11-18 ENCOUNTER — Other Ambulatory Visit (HOSPITAL_COMMUNITY)
Admission: RE | Admit: 2015-11-18 | Discharge: 2015-11-18 | Disposition: A | Discharge status: Home / Self Care | Payer: BLUE CROSS/BLUE SHIELD | Source: Ambulatory Visit | Attending: Family Medicine | Admitting: Family Medicine

## 2015-11-18 DIAGNOSIS — Z01419 Encounter for gynecological examination (general) (routine) without abnormal findings: Secondary | ICD-10-CM | POA: Diagnosis not present

## 2015-11-20 LAB — CYTOLOGY - PAP

## 2016-01-28 ENCOUNTER — Encounter (HOSPITAL_COMMUNITY): Payer: Self-pay | Admitting: *Deleted

## 2016-01-28 ENCOUNTER — Emergency Department (HOSPITAL_COMMUNITY): Payer: BLUE CROSS/BLUE SHIELD

## 2016-01-28 ENCOUNTER — Emergency Department (HOSPITAL_COMMUNITY)
Admission: EM | Admit: 2016-01-28 | Discharge: 2016-01-28 | Disposition: A | Discharge status: Home / Self Care | Payer: BLUE CROSS/BLUE SHIELD | Attending: Emergency Medicine | Admitting: Emergency Medicine

## 2016-01-28 DIAGNOSIS — H81399 Other peripheral vertigo, unspecified ear: Secondary | ICD-10-CM | POA: Insufficient documentation

## 2016-01-28 DIAGNOSIS — G43909 Migraine, unspecified, not intractable, without status migrainosus: Secondary | ICD-10-CM | POA: Insufficient documentation

## 2016-01-28 DIAGNOSIS — F17200 Nicotine dependence, unspecified, uncomplicated: Secondary | ICD-10-CM | POA: Insufficient documentation

## 2016-01-28 DIAGNOSIS — I639 Cerebral infarction, unspecified: Secondary | ICD-10-CM

## 2016-01-28 DIAGNOSIS — R42 Dizziness and giddiness: Secondary | ICD-10-CM | POA: Diagnosis present

## 2016-01-28 LAB — URINALYSIS, ROUTINE W REFLEX MICROSCOPIC
Glucose, UA: NEGATIVE mg/dL
Ketones, ur: NEGATIVE mg/dL
Leukocytes, UA: NEGATIVE
Nitrite: NEGATIVE
Protein, ur: NEGATIVE mg/dL
Specific Gravity, Urine: 1.041 — ABNORMAL HIGH (ref 1.005–1.030)
pH: 6 (ref 5.0–8.0)

## 2016-01-28 LAB — BASIC METABOLIC PANEL
Anion gap: 8 (ref 5–15)
BUN: <5 mg/dL — ABNORMAL LOW (ref 6–20)
CO2: 21 mmol/L — ABNORMAL LOW (ref 22–32)
Calcium: 8.7 mg/dL — ABNORMAL LOW (ref 8.9–10.3)
Chloride: 109 mmol/L (ref 101–111)
Creatinine, Ser: 0.97 mg/dL (ref 0.44–1.00)
GFR calc Af Amer: >60 mL/min (ref >60)
GFR calc non Af Amer: >60 mL/min (ref >60)
Glucose, Bld: 113 mg/dL — ABNORMAL HIGH (ref 65–99)
Potassium: 3.7 mmol/L (ref 3.5–5.1)
Sodium: 138 mmol/L (ref 135–145)

## 2016-01-28 LAB — URINE MICROSCOPIC-ADD ON

## 2016-01-28 LAB — CBC
HCT: 36.8 % (ref 36.0–46.0)
Hemoglobin: 11.2 g/dL — ABNORMAL LOW (ref 12.0–15.0)
MCH: 26.1 pg (ref 26.0–34.0)
MCHC: 30.4 g/dL (ref 30.0–36.0)
MCV: 85.8 fL (ref 78.0–100.0)
Platelets: 203 10*3/uL (ref 150–400)
RBC: 4.29 MIL/uL (ref 3.87–5.11)
RDW: 15 % (ref 11.5–15.5)
WBC: 5 10*3/uL (ref 4.0–10.5)

## 2016-01-28 LAB — I-STAT BETA HCG BLOOD, ED (MC, WL, AP ONLY): I-stat hCG, quantitative: <5 m[IU]/mL (ref <5)

## 2016-01-28 MED ORDER — MECLIZINE HCL 12.5 MG PO TABS: 12.5000 mg | ORAL_TABLET | Freq: Three times a day (TID) | ORAL | 0 refills | Status: AC | PRN

## 2016-01-28 MED ORDER — MECLIZINE HCL 25 MG PO TABS
25.0000 mg | ORAL_TABLET | Freq: Once | ORAL | Status: AC
Start: 2016-01-28 — End: 2016-01-28
  Administered 2016-01-28: 25 mg via ORAL
  Filled 2016-01-28: qty 1

## 2016-01-28 NOTE — ED Provider Notes (Signed)
[facility]-EMERGENCY DEPT Provider Note   CSN: [id number] Arrival date & time: 01/28/16  1637     History   Chief Complaint Chief Complaint  Patient presents with  . Dizziness    HPI Pamela Lewis is a 40 y.o. female.  The history is provided by the patient (joined in ED by husband).  Dizziness  Quality:  Head spinning, vertigo and room spinning Severity:  Severe Onset quality:  Sudden Duration:  2 days Timing:  Intermittent Progression:  Unchanged Chronicity:  New Context: bending over, head movement and standing up   Context: not with eye movement and not with loss of consciousness   Associated symptoms: nausea   Associated symptoms: no headaches, no shortness of breath, no vomiting and no weakness     Past Medical History:  Diagnosis Date  . Anxiety   . Migraine     Patient Active Problem List   Diagnosis Date Noted  . Breast abscess of female 04/06/2013    Past Surgical History:  Procedure Laterality Date  . BREAST REDUCTION SURGERY    . IRRIGATION AND DEBRIDEMENT ABSCESS Right 04/06/2013   Procedure: IRRIGATION AND Drainage RIGHT BREAST ABSCESS;  Surgeon: Wilmon Arms. Corliss Skains, MD;  Location: [facility] OR;  Service: General;  Laterality: Right;    OB History    No data available       Home Medications    Prior to Admission medications   Medication Sig Start Date End Date Taking? Authorizing Provider  acetaminophen (TYLENOL) 500 MG tablet Take 1,000 mg by mouth every 6 (six) hours as needed. For pain   Yes Historical Provider, MD  fluconazole (DIFLUCAN) 200 MG tablet Take 1 tablet (200 mg total) by mouth daily. Patient not taking: Reported on 01/28/2016 04/08/13   Cyndia Bent, MD  meclizine (ANTIVERT) 12.5 MG tablet Take 1 tablet (12.5 mg total) by mouth 3 (three) times daily as needed for dizziness. 01/28/16 02/04/16  Horald Pollen, MD  sulfamethoxazole-trimethoprim (SEPTRA DS) 800-160 MG per tablet Take 1 tablet by mouth every 12 (twelve) hours. Patient not  taking: Reported on 01/28/2016 11/10/13   Renne Crigler, PA-C  traMADol (ULTRAM) 50 MG tablet Take 1 tablet (50 mg total) by mouth every 6 (six) hours as needed. Patient not taking: Reported on 01/28/2016 11/10/13   Renne Crigler, PA-C    Family History History reviewed. No pertinent family history.  Social History Social History  Substance Use Topics  . Smoking status: Current Every Day Smoker  . Smokeless tobacco: Not on file  . Alcohol use Yes     Allergies   Other   Review of Systems Review of Systems  Constitutional: Negative for chills, fatigue and fever.  HENT: Negative for congestion and sinus pressure.   Eyes: Negative for visual disturbance.  Respiratory: Negative for chest tightness and shortness of breath.   Gastrointestinal: Positive for nausea. Negative for abdominal pain and vomiting.  Endocrine: Negative for polydipsia and polyuria.  Musculoskeletal: Positive for gait problem. Negative for back pain and neck pain.       Stumbling toward one side since onset of dizziness  Skin: Negative for rash.  Neurological: Positive for dizziness. Negative for tremors, facial asymmetry, speech difficulty, weakness, numbness and headaches.  Psychiatric/Behavioral: Negative for confusion.     Physical Exam Updated Vital Signs BP 104/73   Pulse 73   Temp 98.5 F (36.9 C)   Resp 18   LMP 01/28/2016   SpO2 100%   Physical Exam  Constitutional: She is oriented to  person, place, and time. She appears well-developed and well-nourished. No distress.  Pleasant, cooperative, well-appearing  HENT:  Head: Normocephalic and atraumatic.  Right Ear: External ear normal.  Left Ear: External ear normal.  Normal TM's bilaterally, no effusions, no cerumen in canals  Eyes: Conjunctivae and EOM are normal. Pupils are equal, round, and reactive to light. No scleral icterus.  5-beat nystagmus bilaterally  Neck: Normal range of motion. Neck supple. No JVD present.  Cardiovascular:  Normal rate, regular rhythm and intact distal pulses.  Exam reveals no gallop and no friction rub.   No murmur heard. Pulmonary/Chest: Effort normal and breath sounds normal. No respiratory distress.  Abdominal: Soft. She exhibits no distension. There is no tenderness.  Musculoskeletal: She exhibits no edema.  Neurological: She is alert and oriented to person, place, and time. No cranial nerve deficit. She exhibits normal muscle tone. Coordination normal.  Symmetric normal finger-to-nose, rapid-alternating movements, and heel-to-shin bilaterally.   Skin: Skin is warm and dry. No rash noted. She is not diaphoretic.  Psychiatric: She has a normal mood and affect.  Nursing note and vitals reviewed.    ED Treatments / Results  Labs (all labs ordered are listed, but only abnormal results are displayed) Labs Reviewed  BASIC METABOLIC PANEL - Abnormal; Notable for the following:       Result Value   CO2 21 (*)    Glucose, Bld 113 (*)    BUN <5 (*)    Calcium 8.7 (*)    All other components within normal limits  CBC - Abnormal; Notable for the following:    Hemoglobin 11.2 (*)    All other components within normal limits  URINALYSIS, ROUTINE W REFLEX MICROSCOPIC (NOT AT [facility]) - Abnormal; Notable for the following:    Color, Urine AMBER (*)    Specific Gravity, Urine 1.041 (*)    Hgb urine dipstick SMALL (*)    Bilirubin Urine SMALL (*)    All other components within normal limits  URINE MICROSCOPIC-ADD ON - Abnormal; Notable for the following:    Squamous Epithelial / LPF 0-5 (*)    Bacteria, UA RARE (*)    All other components within normal limits  I-STAT BETA HCG BLOOD, ED ([facility], [facility], [facility] ONLY)    EKG  EKG Interpretation  Date/Time:  Wednesday January 28 2016 16:56:21 EDT Ventricular Rate:  100 PR Interval:  150 QRS Duration: 64 QT Interval:  344 QTC Calculation: 443 R Axis:   59 Text Interpretation:  Normal sinus rhythm Normal ECG No significant change since last tracing  Confirmed by [facility] MD, Barbara Cower [phone]) on 01/28/2016 9:02:24 PM       Radiology Mr Maxine Glenn Head Wo Contrast  Result Date: 01/28/2016 CLINICAL DATA:  Dizziness for 2 days. History of migraine and anxiety. Assess for stroke. EXAM: MRI HEAD WITHOUT CONTRAST MRA HEAD WITHOUT CONTRAST TECHNIQUE: Multiplanar, multiecho pulse sequences of the brain and surrounding structures were obtained without intravenous contrast. Angiographic images of the head were obtained using MRA technique without contrast. COMPARISON:  None. FINDINGS: MRI HEAD FINDINGS BRAIN: No reduced diffusion to suggest acute ischemia. No susceptibility artifact to suggest hemorrhage. The ventricles and sulci are normal for patient's age. Approximately 10 sub cm supratentorial white matter FLAIR T2 hyperintensities and deep and subcortical distribution. No suspicious parenchymal signal, masses or mass effect. No abnormal extra-axial fluid collections. No extra-axial masses though, contrast enhanced sequences would be more sensitive. VASCULAR: Normal major intracranial vascular flow voids present at skull base. SKULL AND  UPPER CERVICAL SPINE: No abnormal sellar expansion. No suspicious calvarial bone marrow signal. Craniocervical junction maintained. Cerebellar tonsils descend 6 mm below the foramen magnum, mildly pointed in appearance. Mildly effaced cerebral spinal fluid signal at the foramen magnum. SINUSES/ORBITS: The mastoid air-cells and included paranasal sinuses are well-aerated. The included ocular globes and orbital contents are non-suspicious. OTHER: None. MRA HEAD FINDINGS ANTERIOR CIRCULATION: Normal flow related enhancement of the included cervical, petrous, cavernous and supraclinoid internal carotid arteries. Patent anterior communicating artery. Normal flow related enhancement of the anterior and middle cerebral arteries, including distal segments. No large vessel occlusion, high-grade stenosis, abnormal luminal irregularity, aneurysm.  POSTERIOR CIRCULATION: RIGHT vertebral artery is dominant. Basilar artery is patent, with normal flow related enhancement of the main branch vessels. Normal flow related enhancement of the posterior cerebral arteries. Small RIGHT posterior communicating artery present. No large vessel occlusion, high-grade stenosis, abnormal luminal irregularity, aneurysm. IMPRESSION: MRI HEAD: No acute intracranial process. Mild Chiari 1 malformation. At least 10 white matter T2 hyperintensities, mildly advanced for age which can be seen with chronic small vessel ischemic disease, migrainous disorder. Distribution is atypical for demyelination. MRA HEAD: Negative. Electronically Signed   By: Awilda Metroourtnay  Bloomer M.D.   On: 01/28/2016 22:05   Mr Brain Wo Contrast  Result Date: 01/28/2016 CLINICAL DATA:  Dizziness for 2 days. History of migraine and anxiety. Assess for stroke. EXAM: MRI HEAD WITHOUT CONTRAST MRA HEAD WITHOUT CONTRAST TECHNIQUE: Multiplanar, multiecho pulse sequences of the brain and surrounding structures were obtained without intravenous contrast. Angiographic images of the head were obtained using MRA technique without contrast. COMPARISON:  None. FINDINGS: MRI HEAD FINDINGS BRAIN: No reduced diffusion to suggest acute ischemia. No susceptibility artifact to suggest hemorrhage. The ventricles and sulci are normal for patient's age. Approximately 10 sub cm supratentorial white matter FLAIR T2 hyperintensities and deep and subcortical distribution. No suspicious parenchymal signal, masses or mass effect. No abnormal extra-axial fluid collections. No extra-axial masses though, contrast enhanced sequences would be more sensitive. VASCULAR: Normal major intracranial vascular flow voids present at skull base. SKULL AND UPPER CERVICAL SPINE: No abnormal sellar expansion. No suspicious calvarial bone marrow signal. Craniocervical junction maintained. Cerebellar tonsils descend 6 mm below the foramen magnum, mildly pointed  in appearance. Mildly effaced cerebral spinal fluid signal at the foramen magnum. SINUSES/ORBITS: The mastoid air-cells and included paranasal sinuses are well-aerated. The included ocular globes and orbital contents are non-suspicious. OTHER: None. MRA HEAD FINDINGS ANTERIOR CIRCULATION: Normal flow related enhancement of the included cervical, petrous, cavernous and supraclinoid internal carotid arteries. Patent anterior communicating artery. Normal flow related enhancement of the anterior and middle cerebral arteries, including distal segments. No large vessel occlusion, high-grade stenosis, abnormal luminal irregularity, aneurysm. POSTERIOR CIRCULATION: RIGHT vertebral artery is dominant. Basilar artery is patent, with normal flow related enhancement of the main branch vessels. Normal flow related enhancement of the posterior cerebral arteries. Small RIGHT posterior communicating artery present. No large vessel occlusion, high-grade stenosis, abnormal luminal irregularity, aneurysm. IMPRESSION: MRI HEAD: No acute intracranial process. Mild Chiari 1 malformation. At least 10 white matter T2 hyperintensities, mildly advanced for age which can be seen with chronic small vessel ischemic disease, migrainous disorder. Distribution is atypical for demyelination. MRA HEAD: Negative. Electronically Signed   By: Awilda Metroourtnay  Bloomer M.D.   On: 01/28/2016 22:05    Procedures Procedures (including critical care time)  Medications Ordered in ED Medications  meclizine (ANTIVERT) tablet 25 mg (25 mg Oral Given 01/28/16 1935)     Initial Impression /  Assessment and Plan / ED Course  I have reviewed the triage vital signs and the nursing notes.  Pertinent labs & imaging results that were available during my care of the patient were reviewed by me and considered in my medical decision making (see chart for details).  Clinical Course   Leilanny Weimann is a 40 y.o. female without significant PMHx who presents to ED for  evaluation of 2 days of room-spinning dizziness, worse with movements of head and body, no headache or visual changes. Never had similar previously. No risk factors for vascular disease. Based on H&P, concern for BPPV, given meclizine with some improvement but persistence of dizziness. Noted to have symmetric 5-beat nystagmus bilaterally. Though no ataxia on exam, pt reports drifting toward one side of the hallway all day today, concern for possible cerebellar etiology. 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 Doubt sx are caused by the type 1 ACM, as she has never had similar previously. However, sx do not perfectly fit BPPV either. Rx Meclizine and advised to f/u with PCP. If sx persist for >1-2 weeks, may benefit outpatient neurology evaluation. Told pt about the type 1 Arnold-Chiari malformation, wrote it down for her. Advised no driving until complete resolution of vertigo and no driving while taking meclizine. Advised to return to ER for any new, worse, or concerning symptoms. She and her husband demonstrates understanding of this and comfort with d/c home.  Pt condition, course, and discharge were discussed with attending physician Dr. Marily Memos.  Final Clinical Impressions(s) / ED Diagnoses   Final diagnoses:  Peripheral vertigo, unspecified laterality    New Prescriptions Discharge Medication List as of 01/28/2016  7:52 PM    START taking these medications   Details  meclizine (ANTIVERT) 12.5 MG tablet Take 1 tablet (12.5 mg total) by mouth 3 (three) times daily as needed for dizziness., Starting Wed 01/28/2016, Until Wed 02/04/2016, Print         Horald Pollen, MD 01/29/16 1610    Marily Memos, MD 01/29/16 [id number]

## 2016-01-28 NOTE — ED Triage Notes (Signed)
Pt reports having dizziness x 2 days. "feels like room is spinning." denies n/v. Denies hx of vertigo.

## 2016-01-28 NOTE — ED Notes (Signed)
Patient transported to MRI 

## 2017-04-08 ENCOUNTER — Other Ambulatory Visit: Payer: Self-pay

## 2017-04-08 ENCOUNTER — Emergency Department (HOSPITAL_COMMUNITY): Payer: BLUE CROSS/BLUE SHIELD

## 2017-04-08 ENCOUNTER — Emergency Department (HOSPITAL_COMMUNITY)
Admission: EM | Admit: 2017-04-08 | Discharge: 2017-04-08 | Disposition: A | Discharge status: Home / Self Care | Payer: BLUE CROSS/BLUE SHIELD | Attending: Emergency Medicine | Admitting: Emergency Medicine

## 2017-04-08 ENCOUNTER — Encounter (HOSPITAL_COMMUNITY): Payer: Self-pay | Admitting: Emergency Medicine

## 2017-04-08 DIAGNOSIS — G8929 Other chronic pain: Secondary | ICD-10-CM

## 2017-04-08 DIAGNOSIS — F17200 Nicotine dependence, unspecified, uncomplicated: Secondary | ICD-10-CM | POA: Insufficient documentation

## 2017-04-08 DIAGNOSIS — M25561 Pain in right knee: Secondary | ICD-10-CM | POA: Diagnosis present

## 2017-04-08 MED ORDER — HYDROCODONE-ACETAMINOPHEN 5-325 MG PO TABS
1.0000 | ORAL_TABLET | Freq: Once | ORAL | Status: AC
  Administered 2017-04-08: 1 via ORAL
  Filled 2017-04-08: qty 1

## 2017-04-08 MED ORDER — IBUPROFEN 200 MG PO TABS
600.0000 mg | ORAL_TABLET | Freq: Once | ORAL | Status: AC
  Administered 2017-04-08: 600 mg via ORAL
  Filled 2017-04-08: qty 1

## 2017-04-08 NOTE — ED Provider Notes (Signed)
[facility]ENC[facility] Provider Note   CSN: [id number] Arrival date & time: 04/08/17  0932     History   Chief Complaint Chief Complaint  Patient presents with  . Knee Pain    HPI Mairyn Katrinka BlazingSmith is a 41 y.o. female.  HPI   Ms. Katrinka BlazingSmith is a 41 year old female with a history of migraines who presents emergency department for evaluation of right knee pain.  Patient states that she has had bilateral knee pain for several years, lost a significant amount of weight and had subsequent improvement in her pain.  Reports that last week she began to have worsening right knee pain.  Denies inciting injury.  States that pain over the knee joint is generalized, no specific point tenderness.  It is 7/10 in severity, constant and worsened with sitting for long periods or after being on her feet all day.  She has tried ibuprofen, Tylenol, ice and elevation without significant improvement.  Pain is worsened after she went horseback riding last week.  States that her knee crackles and pops whenever she tries to move it.  Also notes that the knee appears to be mildly swollen, no erythema.  She denies fever, night sweats, fatigue, numbness, weakness, joint pain elsewhere.  She denies history of IV drug use, diabetes, chronic steroid use, HIV.  She is able to ambulate independently despite pain.  She denies calf pain, leg swelling, shortness of breath, chest pain, history of DVT/PE, recent immobility or surgery.  She does not have a primary care provider.  Past Medical History:  Diagnosis Date  . Anxiety   . Migraine     Patient Active Problem List   Diagnosis Date Noted  . Breast abscess of female 04/06/2013    Past Surgical History:  Procedure Laterality Date  . BREAST REDUCTION SURGERY    . IRRIGATION AND DEBRIDEMENT ABSCESS Right 04/06/2013   Procedure: IRRIGATION AND Drainage RIGHT BREAST ABSCESS;  Surgeon: Wilmon ArmsMatthew K. Corliss Skainssuei, MD;  Location: [facility] OR;  Service: General;   Laterality: Right;    OB History    No data available       Home Medications    Prior to Admission medications   Medication Sig Start Date End Date Taking? Authorizing Provider  acetaminophen (TYLENOL) 500 MG tablet Take 1,000 mg by mouth every 6 (six) hours as needed. For pain    [provider]  fluconazole (DIFLUCAN) 200 MG tablet Take 1 tablet (200 mg total) by mouth daily. Patient not taking: Reported on 01/28/2016 04/08/13   Cyndia BentStreck, Christian, MD  sulfamethoxazole-trimethoprim (SEPTRA DS) 800-160 MG per tablet Take 1 tablet by mouth every 12 (twelve) hours. Patient not taking: Reported on 01/28/2016 11/10/13   Renne CriglerGeiple, Joshua, PA-C  traMADol (ULTRAM) 50 MG tablet Take 1 tablet (50 mg total) by mouth every 6 (six) hours as needed. Patient not taking: Reported on 01/28/2016 11/10/13   Renne CriglerGeiple, Joshua, PA-C    Family History No family history on file.  Social History Social History   Tobacco Use  . Smoking status: Current Every Day Smoker  Substance Use Topics  . Alcohol use: Yes  . Drug use: No     Allergies   Other   Review of Systems Review of Systems  Constitutional: Negative for chills, diaphoresis, fatigue and fever.  Eyes: Negative for visual disturbance.  Respiratory: Negative for shortness of breath.   Cardiovascular: Negative for chest pain.  Gastrointestinal: Negative for abdominal pain, nausea and vomiting.  Genitourinary: Negative for difficulty  urinating and dysuria.  Musculoskeletal: Positive for arthralgias (right knee pain) and joint swelling. Negative for gait problem.  Skin: Negative for color change, rash and wound.  Neurological: Negative for weakness, numbness and headaches.  Psychiatric/Behavioral: Negative for agitation.     Physical Exam Updated Vital Signs BP (!) 111/95 (BP Location: Right Arm)   Pulse 82   Temp 98.7 F (37.1 C) (Oral)   Resp 18   LMP 03/26/2017   SpO2 100%   Physical Exam  Constitutional: She is  oriented to person, place, and time. She appears well-developed and well-nourished. No distress.  HENT:  Head: Normocephalic and atraumatic.  Mouth/Throat: Oropharynx is clear and moist. No oropharyngeal exudate.  Eyes: Conjunctivae are normal. Pupils are equal, round, and reactive to light. Right eye exhibits no discharge. Left eye exhibits no discharge.  Neck: Normal range of motion. Neck supple.  Cardiovascular: Normal rate, regular rhythm and intact distal pulses.  No murmur heard. Pulmonary/Chest: Effort normal and breath sounds normal. No stridor. No respiratory distress. She has no wheezes. She has no rales.  Abdominal: Soft. Bowel sounds are normal. There is no tenderness. There is no guarding.  Musculoskeletal:  Right knee with tenderness to palpation grossly. Full active ROM with crepitus. Mild joint swelling appreciated compared to left. No abnormal alignment or patellar mobility. No bruising, erythema or warmth overlaying the joint. No varus/valgus laxity. Negative drawer's, Lachman's and McMurray's. 2+ DP pulses bilaterally. All compartments are soft. Distal sensation to light touch intact.    Neurological: She is alert and oriented to person, place, and time. Coordination normal.  Skin: Skin is warm and dry. Capillary refill takes less than 2 seconds. She is not diaphoretic.  Psychiatric: She has a normal mood and affect. Her behavior is normal.  Nursing note and vitals reviewed.    ED Treatments / Results  Labs (all labs ordered are listed, but only abnormal results are displayed) Labs Reviewed - No data to display  EKG  EKG Interpretation None       Radiology No results found.  Procedures Procedures (including critical care time)  Medications Ordered in ED Medications - No data to display   Initial Impression / Assessment and Plan / ED Course  I have reviewed the triage vital signs and the nursing notes.  Pertinent labs & imaging results that were  available during my care of the patient were reviewed by me and considered in my medical decision making (see chart for details).    Patient presents with ongoing right knee pain. Has a history similar intermittent knee pain knee for years. No recent injury. She is afebrile and non-toxic appearing. On exam, no overlying erythema, warmth or rash to suggest infection. Xray of right knee shows generalized osteoarthritic changes.  Have counseled patient on NSAID and Tylenol use.  Have given her information to follow-up with PCP office for further management.  Counseled her on return precautions and patient agrees and voiced understanding to the above plan. She has no complaints prior to d/c.   Final Clinical Impressions(s) / ED Diagnoses   Final diagnoses:  Chronic pain of right knee    ED Discharge Orders    None       Kellie ShropshireShrosbree, Emily J, PA-C 04/08/17 1136    Melene PlanFloyd, Dan, DO 04/08/17 1601

## 2017-04-08 NOTE — Discharge Instructions (Signed)
The x-ray of your right knee shows osteoarthritis.   Please take Tylenol and ibuprofen for your pain.  Also use the brace for support.  Elevate the right knee when you can.  I have listed [facility] and [facility] (PCP office to establish care.)  Please schedule an appointment for follow-up on your osteoarthritis.  Return to the emergency department if you develop redness and warmth of the knee joint, have numbness in which she can no longer feel foot or knee or have any new or worsening symptoms.

## 2017-04-08 NOTE — ED Notes (Signed)
ED Provider at bedside. Irving BurtonEmily

## 2017-04-08 NOTE — ED Notes (Signed)
Patient not in room. Transported to xray.

## 2017-04-08 NOTE — ED Triage Notes (Signed)
The patient has bilateral knee pain, right worse than left. Pt has swelling to anterior knee but pain to both posterior and anterior. States she has had tenderness last Tuesday, has been taking motrin and works on her feet/and went horse back riding and felt worsening pain when riding. Did feel a pop to her right knee when she went to put it on a pillow. Left knee has tenderness as well. Pt also has pain to bilateral calves.

## 2017-06-16 ENCOUNTER — Other Ambulatory Visit: Payer: Self-pay

## 2017-06-16 ENCOUNTER — Encounter (HOSPITAL_COMMUNITY): Payer: Self-pay | Admitting: Emergency Medicine

## 2017-06-16 DIAGNOSIS — F17200 Nicotine dependence, unspecified, uncomplicated: Secondary | ICD-10-CM | POA: Diagnosis not present

## 2017-06-16 DIAGNOSIS — Z79899 Other long term (current) drug therapy: Secondary | ICD-10-CM | POA: Diagnosis not present

## 2017-06-16 DIAGNOSIS — R109 Unspecified abdominal pain: Secondary | ICD-10-CM | POA: Insufficient documentation

## 2017-06-16 DIAGNOSIS — R110 Nausea: Secondary | ICD-10-CM | POA: Insufficient documentation

## 2017-06-16 LAB — COMPREHENSIVE METABOLIC PANEL
ALT: 19 U/L (ref 14–54)
AST: 21 U/L (ref 15–41)
Albumin: 3.7 g/dL (ref 3.5–5.0)
Alkaline Phosphatase: 85 U/L (ref 38–126)
Anion gap: 9 (ref 5–15)
BUN: 7 mg/dL (ref 6–20)
CO2: 21 mmol/L — ABNORMAL LOW (ref 22–32)
Calcium: 8.7 mg/dL — ABNORMAL LOW (ref 8.9–10.3)
Chloride: 106 mmol/L (ref 101–111)
Creatinine, Ser: 0.95 mg/dL (ref 0.44–1.00)
GFR calc Af Amer: >60 mL/min (ref >60)
GFR calc non Af Amer: >60 mL/min (ref >60)
Glucose, Bld: 97 mg/dL (ref 65–99)
Potassium: 3.7 mmol/L (ref 3.5–5.1)
Sodium: 136 mmol/L (ref 135–145)
Total Bilirubin: 0.7 mg/dL (ref 0.3–1.2)
Total Protein: 6.9 g/dL (ref 6.5–8.1)

## 2017-06-16 LAB — I-STAT BETA HCG BLOOD, ED (MC, WL, AP ONLY): I-stat hCG, quantitative: <5 m[IU]/mL (ref <5)

## 2017-06-16 LAB — CBC
HCT: 36.7 % (ref 36.0–46.0)
Hemoglobin: 11.9 g/dL — ABNORMAL LOW (ref 12.0–15.0)
MCH: 28.4 pg (ref 26.0–34.0)
MCHC: 32.4 g/dL (ref 30.0–36.0)
MCV: 87.6 fL (ref 78.0–100.0)
Platelets: 235 10*3/uL (ref 150–400)
RBC: 4.19 MIL/uL (ref 3.87–5.11)
RDW: 13.4 % (ref 11.5–15.5)
WBC: 5.3 10*3/uL (ref 4.0–10.5)

## 2017-06-16 LAB — LIPASE, BLOOD: Lipase: 36 U/L (ref 11–51)

## 2017-06-16 NOTE — ED Triage Notes (Signed)
Patient with abdominal pain on the left side for the last two weeks.  Patient has recently moved to [place] area and has not gotten a PCP or OBGYN.  She has titanium coils for birth control, which she is assuming is what is the problem.  She states last months cycle was not quite right.  She states that she is having some nausea but not sure what is going on.

## 2017-06-17 ENCOUNTER — Emergency Department (HOSPITAL_COMMUNITY): Payer: Commercial Managed Care - PPO

## 2017-06-17 ENCOUNTER — Emergency Department (HOSPITAL_COMMUNITY)
Admission: EM | Admit: 2017-06-17 | Discharge: 2017-06-17 | Disposition: A | Discharge status: Home / Self Care | Payer: Commercial Managed Care - PPO | Attending: Emergency Medicine | Admitting: Emergency Medicine

## 2017-06-17 DIAGNOSIS — R109 Unspecified abdominal pain: Secondary | ICD-10-CM

## 2017-06-17 LAB — URINALYSIS, ROUTINE W REFLEX MICROSCOPIC
Bacteria, UA: NONE SEEN
Bilirubin Urine: NEGATIVE
Glucose, UA: NEGATIVE mg/dL
Ketones, ur: NEGATIVE mg/dL
Leukocytes, UA: NEGATIVE
Nitrite: NEGATIVE
Protein, ur: NEGATIVE mg/dL
Specific Gravity, Urine: >1.046 — ABNORMAL HIGH (ref 1.005–1.030)
pH: 6 (ref 5.0–8.0)

## 2017-06-17 MED ORDER — ONDANSETRON HCL 4 MG/2ML IJ SOLN: 4.0000 mg | Freq: Four times a day (QID) | INTRAMUSCULAR | Status: DC | PRN

## 2017-06-17 MED ORDER — ONDANSETRON 8 MG PO TBDP: 8.0000 mg | ORAL_TABLET | Freq: Three times a day (TID) | ORAL | 0 refills | Status: DC | PRN

## 2017-06-17 MED ORDER — IOPAMIDOL (ISOVUE-300) INJECTION 61%
INTRAVENOUS | Status: AC
  Administered 2017-06-17: 100 mL
  Filled 2017-06-17: qty 100

## 2017-06-17 MED ORDER — MORPHINE SULFATE (PF) 4 MG/ML IV SOLN
6.0000 mg | Freq: Once | INTRAVENOUS | Status: AC
  Administered 2017-06-17: 6 mg via INTRAVENOUS
  Filled 2017-06-17: qty 2

## 2017-06-17 NOTE — ED Notes (Signed)
Patient transported to CT 

## 2017-06-17 NOTE — ED Notes (Signed)
ED Provider at bedside. 

## 2017-06-20 NOTE — ED Provider Notes (Signed)
[facility] Provider [facility]   CSN: [id number] Arrival date & time: 06/16/17  2109     History   Chief Complaint Chief Complaint  Patient presents with  . Abdominal Pain    HPI Berlyn Katrinka BlazingSmith is a 42 y.o. female.  HPI Patient is a 42 year old female who presents the emergency department with complaints of 2 weeks of left-sided abdominal pain which is been waxing and waning but is now constant over the past several days.  She reports nausea without vomiting.  Denies diarrhea.  Denies vaginal bleeding.  She reports abnormal vaginal bleeding last month.  Does not believe she is pregnant.  No fevers or chills.  Denies dysuria or urinary frequency.  Symptoms are mild to moderate in severity and worse with palpation of the left side of her abdomen.   Past Medical History:  Diagnosis Date  . Anxiety   . Migraine     Patient Active Problem List   Diagnosis Date Noted  . Breast abscess of female 04/06/2013    Past Surgical History:  Procedure Laterality Date  . BREAST REDUCTION SURGERY    . IRRIGATION AND DEBRIDEMENT ABSCESS Right 04/06/2013   Procedure: IRRIGATION AND Drainage RIGHT BREAST ABSCESS;  Surgeon: Wilmon ArmsMatthew K. Corliss Skainssuei, MD;  Location: [facility] OR;  Service: General;  Laterality: Right;    OB History    No data available       Home Medications    Prior to Admission medications   Medication Sig Start Date End Date Taking? Authorizing Provider  Ginger, Zingiber officinalis, (GINGER PO) Take 1 tablet by mouth daily.   Yes [provider]  Multiple Vitamin (MULTIVITAMIN WITH MINERALS) TABS tablet Take 1 tablet by mouth daily.   Yes [provider]  TURMERIC PO Take 1 tablet by mouth daily.   Yes [provider]  ondansetron (ZOFRAN ODT) 8 MG disintegrating tablet Take 1 tablet (8 mg total) by mouth every 8 (eight) hours as needed for nausea or vomiting. 06/17/17   Azalia Bilisampos, Seleste Tallman, MD    Family History No family  history on file.  Social History Social History   Tobacco Use  . Smoking status: Current Every Day Smoker  . Smokeless tobacco: Never Used  Substance Use Topics  . Alcohol use: Yes  . Drug use: No     Allergies   Other   Review of Systems Review of Systems  All other systems reviewed and are negative.    Physical Exam Updated Vital Signs BP 117/80   Pulse 74   Temp 99.3 F (37.4 C) (Oral)   Resp 18   Ht 5\' 3"  (1.6 m)   Wt 97.5 kg (215 lb)   LMP 06/15/2017 (Exact Date)   SpO2 100%   BMI 38.09 kg/m   Physical Exam  Constitutional: She is oriented to person, place, and time. She appears well-developed and well-nourished. No distress.  HENT:  Head: Normocephalic and atraumatic.  Eyes: EOM are normal.  Neck: Normal range of motion.  Cardiovascular: Normal rate, regular rhythm and normal heart sounds.  Pulmonary/Chest: Effort normal and breath sounds normal.  Abdominal: Soft. She exhibits no distension.  Left-sided abdominal tenderness without guarding or rebound.  Musculoskeletal: Normal range of motion.  Neurological: She is alert and oriented to person, place, and time.  Skin: Skin is warm and dry.  Psychiatric: She has a normal mood and affect. Judgment normal.  Nursing note and vitals reviewed.    ED Treatments / Results  Labs (  all labs ordered are listed, but only abnormal results are displayed) Labs Reviewed  COMPREHENSIVE METABOLIC PANEL - Abnormal; Notable for the following components:      Result Value   CO2 21 (*)    Calcium 8.7 (*)    All other components within normal limits  CBC - Abnormal; Notable for the following components:   Hemoglobin 11.9 (*)    All other components within normal limits  URINALYSIS, ROUTINE W REFLEX MICROSCOPIC - Abnormal; Notable for the following components:   Specific Gravity, Urine >1.046 (*)    Hgb urine dipstick MODERATE (*)    Squamous Epithelial / LPF 0-5 (*)    All other components within normal limits    LIPASE, BLOOD  I-STAT BETA HCG BLOOD, ED ([facility], [facility], [facility] ONLY)    EKG  EKG Interpretation None       Radiology IMPRESSION: 1. No acute intra-abdominal or pelvic pathology. No bowel obstruction or active inflammation. Normal appendix. 2. An 8 mm nonobstructing stone in the upper pole of the right kidney with associated mild dilatation of the adjacent calyx and focal parenchyma atrophy/scarring.  Procedures Procedures (including critical care time)  Medications Ordered in ED Medications  morphine 4 MG/ML injection 6 mg (6 mg Intravenous Given 06/17/17 0453)  iopamidol (ISOVUE-300) 61 % injection (100 mLs  Contrast Given 06/17/17 0530)     Initial Impression / Assessment and Plan / ED Course  I have reviewed the triage vital signs and the nursing notes.  Pertinent labs & imaging results that were available during my care of the patient were reviewed by me and considered in my medical decision making (see chart for details).     Unclear etiology of left-sided abdominal pain.  No vaginal complaints or vaginal discharge.  Outpatient primary care and gynecology follow-up.  Discharged home in good condition.  Patient understands return to the ER for new or worsening symptoms  Final Clinical Impressions(s) / ED Diagnoses   Final diagnoses:  Abdominal pain, unspecified abdominal location    ED Discharge Orders        Ordered    ondansetron (ZOFRAN ODT) 8 MG disintegrating tablet  Every 8 hours PRN     06/17/17 1610       Azalia Bilis, MD 06/20/17 [phone]

## 2017-10-29 ENCOUNTER — Emergency Department (HOSPITAL_COMMUNITY)
Admission: EM | Admit: 2017-10-29 | Discharge: 2017-10-29 | Disposition: A | Discharge status: Home / Self Care | Payer: Commercial Managed Care - PPO | Attending: Emergency Medicine | Admitting: Emergency Medicine

## 2017-10-29 ENCOUNTER — Encounter (HOSPITAL_COMMUNITY): Payer: Self-pay | Admitting: Emergency Medicine

## 2017-10-29 DIAGNOSIS — L02214 Cutaneous abscess of groin: Secondary | ICD-10-CM | POA: Diagnosis not present

## 2017-10-29 DIAGNOSIS — L02211 Cutaneous abscess of abdominal wall: Secondary | ICD-10-CM | POA: Insufficient documentation

## 2017-10-29 DIAGNOSIS — L0291 Cutaneous abscess, unspecified: Secondary | ICD-10-CM

## 2017-10-29 DIAGNOSIS — F17210 Nicotine dependence, cigarettes, uncomplicated: Secondary | ICD-10-CM | POA: Diagnosis not present

## 2017-10-29 DIAGNOSIS — L02416 Cutaneous abscess of left lower limb: Secondary | ICD-10-CM | POA: Diagnosis not present

## 2017-10-29 DIAGNOSIS — L02415 Cutaneous abscess of right lower limb: Secondary | ICD-10-CM | POA: Insufficient documentation

## 2017-10-29 LAB — CBC WITH DIFFERENTIAL/PLATELET
Abs Immature Granulocytes: 0 10*3/uL (ref 0.0–0.1)
Basophils Absolute: 0 10*3/uL (ref 0.0–0.1)
Basophils Relative: 1 %
Eosinophils Absolute: 0.3 10*3/uL (ref 0.0–0.7)
Eosinophils Relative: 6 %
HCT: 37 % (ref 36.0–46.0)
Hemoglobin: 11.7 g/dL — ABNORMAL LOW (ref 12.0–15.0)
Immature Granulocytes: 0 %
Lymphocytes Relative: 26 %
Lymphs Abs: 1.1 10*3/uL (ref 0.7–4.0)
MCH: 28.3 pg (ref 26.0–34.0)
MCHC: 31.6 g/dL (ref 30.0–36.0)
MCV: 89.6 fL (ref 78.0–100.0)
Monocytes Absolute: 0.3 10*3/uL (ref 0.1–1.0)
Monocytes Relative: 8 %
Neutro Abs: 2.5 10*3/uL (ref 1.7–7.7)
Neutrophils Relative %: 59 %
Platelets: 198 10*3/uL (ref 150–400)
RBC: 4.13 MIL/uL (ref 3.87–5.11)
RDW: 14 % (ref 11.5–15.5)
WBC: 4.2 10*3/uL (ref 4.0–10.5)

## 2017-10-29 LAB — COMPREHENSIVE METABOLIC PANEL
ALT: 15 U/L (ref 0–44)
AST: 16 U/L (ref 15–41)
Albumin: 3.3 g/dL — ABNORMAL LOW (ref 3.5–5.0)
Alkaline Phosphatase: 77 U/L (ref 38–126)
Anion gap: 7 (ref 5–15)
BUN: 9 mg/dL (ref 6–20)
CO2: 23 mmol/L (ref 22–32)
Calcium: 8.6 mg/dL — ABNORMAL LOW (ref 8.9–10.3)
Chloride: 106 mmol/L (ref 98–111)
Creatinine, Ser: 0.88 mg/dL (ref 0.44–1.00)
GFR calc Af Amer: >60 mL/min (ref >60)
GFR calc non Af Amer: >60 mL/min (ref >60)
Glucose, Bld: 86 mg/dL (ref 70–99)
Potassium: 3.5 mmol/L (ref 3.5–5.1)
Sodium: 136 mmol/L (ref 135–145)
Total Bilirubin: 0.7 mg/dL (ref 0.3–1.2)
Total Protein: 6.5 g/dL (ref 6.5–8.1)

## 2017-10-29 LAB — I-STAT CG4 LACTIC ACID, ED: Lactic Acid, Venous: 0.66 mmol/L (ref 0.5–1.9)

## 2017-10-29 MED ORDER — FLUCONAZOLE 200 MG PO TABS: 200.0000 mg | ORAL_TABLET | Freq: Once | ORAL | 0 refills | Status: AC

## 2017-10-29 MED ORDER — DOXYCYCLINE HYCLATE 100 MG PO CAPS: 100.0000 mg | ORAL_CAPSULE | Freq: Two times a day (BID) | ORAL | 0 refills | Status: AC

## 2017-10-29 MED ORDER — LIDOCAINE-EPINEPHRINE (PF) 2 %-1:200000 IJ SOLN
5.0000 mL | Freq: Once | INTRAMUSCULAR | Status: DC
  Filled 2017-10-29: qty 20

## 2017-10-29 NOTE — ED Provider Notes (Signed)
[facility] EMERGENCY DEPARTMENT Provider Note   CSN: [id number] Arrival date & time: 10/29/17  1430  History   Chief Complaint Chief Complaint  Patient presents with  . Abscess   HPI Patient is a 42 year old female with history of anxiety and multiple prior abscesses presenting to the for multiple skin abscesses. She completed a course of amoxicillin after dental procedure a few weeks ago and over the last week has had worsening small painful areas of skin lesions over the abdomen, groin, and legs. She has had similar abscesses in the past which typically drain on their own. She has been admitted before for breast abscesses. They have all been draining spontaneously and with application of topical antibiotics and warm compresses. No fever, vomiting, or other recent illness.  Past Medical History:  Diagnosis Date  . Anxiety   . Migraine     Patient Active Problem List   Diagnosis Date Noted  . Breast abscess of female 04/06/2013    Past Surgical History:  Procedure Laterality Date  . BREAST REDUCTION SURGERY    . IRRIGATION AND DEBRIDEMENT ABSCESS Right 04/06/2013   Procedure: IRRIGATION AND Drainage RIGHT BREAST ABSCESS;  Surgeon: Wilmon Arms. Corliss Skains, MD;  Location: [facility] OR;  Service: General;  Laterality: Right;     OB History   None      Home Medications    Prior to Admission medications   Medication Sig Start Date End Date Taking? Authorizing Provider  doxycycline (VIBRAMYCIN) 100 MG capsule Take 1 capsule (100 mg total) by mouth 2 (two) times daily for 7 days. 10/29/17 11/05/17  Cecille Po, MD  fluconazole (DIFLUCAN) 200 MG tablet Take 1 tablet (200 mg total) by mouth once for 1 dose. 10/29/17 10/29/17  Cecille Po, MD  Ginger, Zingiber officinalis, (GINGER PO) Take 1 tablet by mouth daily.    [provider]  Multiple Vitamin (MULTIVITAMIN WITH MINERALS) TABS tablet Take 1 tablet by mouth daily.    [provider]    ondansetron (ZOFRAN ODT) 8 MG disintegrating tablet Take 1 tablet (8 mg total) by mouth every 8 (eight) hours as needed for nausea or vomiting. 06/17/17   Azalia Bilis, MD  TURMERIC PO Take 1 tablet by mouth daily.    [provider]    Family History History reviewed. No pertinent family history.  Social History Social History   Tobacco Use  . Smoking status: Current Every Day Smoker  . Smokeless tobacco: Never Used  Substance Use Topics  . Alcohol use: Yes  . Drug use: No     Allergies   Other   Review of Systems Review of Systems  Constitutional: Negative for fever.  HENT: Negative for congestion and sore throat.   Eyes: Negative for visual disturbance.  Respiratory: Negative for shortness of breath.   Cardiovascular: Negative for chest pain.  Gastrointestinal: Negative for abdominal pain, diarrhea and vomiting.  Genitourinary: Negative for dysuria.  Musculoskeletal: Negative for neck pain.  Skin: Positive for wound.  Neurological: Negative for syncope.  All other systems reviewed and are negative.    Physical Exam Updated Vital Signs BP (!) 125/93   Pulse 72   Temp 98.6 F (37 C) (Oral)   Resp 16   SpO2 97%   Physical Exam  Constitutional: She is oriented to person, place, and time. No distress.  HENT:  Head: Normocephalic and atraumatic.  Mouth/Throat: Oropharynx is clear and moist.  Normal gingiva  Eyes: Pupils are equal, round, and reactive  to light. Conjunctivae are normal.  Neck: Neck supple. No tracheal deviation present.  Cardiovascular: Normal rate, regular rhythm, normal heart sounds and intact distal pulses.  No murmur heard. Pulmonary/Chest: Effort normal and breath sounds normal. No stridor. No respiratory distress. She has no wheezes. She has no rales.  Abdominal: Soft. She exhibits no distension and no mass. There is no tenderness. There is no guarding.  Musculoskeletal: She exhibits no edema or deformity.  Neurological: She is  alert and oriented to person, place, and time.  Skin: Skin is warm and dry.  There are multiple small area of purulent draining with surrounding induration and mild erythema over the lower abdomen and groin. There are additional two small pustules over the BLE.  Psychiatric: She has a normal mood and affect. Her behavior is normal.  Nursing note and vitals reviewed.    ED Treatments / Results  Labs (all labs ordered are listed, but only abnormal results are displayed) Labs Reviewed  CBC WITH DIFFERENTIAL/PLATELET - Abnormal; Notable for the following components:      Result Value   Hemoglobin 11.7 (*)    All other components within normal limits  COMPREHENSIVE METABOLIC PANEL - Abnormal; Notable for the following components:   Calcium 8.6 (*)    Albumin 3.3 (*)    All other components within normal limits  I-STAT CG4 LACTIC ACID, ED  I-STAT CG4 LACTIC ACID, ED    EKG None  Radiology No results found.  Procedures Procedures (including critical care time) EMERGENCY DEPARTMENT US SOFT TISSUE INTERPRETATION "Study: Limited Soft Tissue Ultrasound"  INDICATIONS: Pain and Soft tissue infection Multiple views of the body part were obtained in real-time with a multi-frequency linear probe  PERFORMED BY: Myself IMAGES ARCHIVED?: Yes SIDE:Right  BODY PART:Abdominal wall INTERPRETATION:  Superficial fluid collection which appears dermal and communicates with the skin. No deep fluid collection.   Medications Ordered in ED Medications  lidocaine-EPINEPHrine (XYLOCAINE W/EPI) 2 %-1:200000 (PF) injection 5 mL (5 mLs Intradermal Not Given 10/29/17 1700)     Initial Impression / Assessment and Plan / ED Course  I have reviewed the triage vital signs and the nursing notes.  Pertinent labs & imaging results that were available during my care of the patient were reviewed by me and considered in my medical decision making (see chart for details).  Patient presented for multiple skin  lesions consistent with small superficial abscesses. Considered hidradenitis but location of pustules on legs and abdominal wall is atypical for this. Her only significant area of induration is over the right abdominal wall which has started draining. We evaluated this with ultrasound which showed very superficial fluid collection which we're to manually express without formal incision or aspiration.   No systemic symptoms. She is not diabetic and screening labs are reassuring. No evidence of necrotizing infection. Will give course of doxycycline for increased staphylococcal coverage and advise PCP and dermatology follow-up. Patient also requested prophylactic diflucan given frequent yeast infections after antibiotics, which I provided.  Patient informed of all ED findings. Return precautions and follow up plan reviewed. All questions answered.   Final Clinical Impressions(s) / ED Diagnoses   Final diagnoses:  Abscess of multiple sites    ED Discharge Orders        Ordered    doxycycline (VIBRAMYCIN) 100 MG capsule  2 times daily     10/29/17 1705    fluconazole (DIFLUCAN) 200 MG tablet   Once     10/29/17 1716  Cecille Po, MD 10/29/17 Alvina Filbert    Blane Ohara, MD 10/30/17 [phone]

## 2017-10-29 NOTE — ED Notes (Signed)
Abdominal lesion Wound covered with dry woven gauze per MD

## 2017-10-29 NOTE — ED Notes (Signed)
Patient verbalizes understanding of discharge instructions. Opportunity for questioning and answers were provided. Armband removed by staff, pt discharged from ED ambulatory.   

## 2017-10-29 NOTE — ED Triage Notes (Signed)
Pt presents to ED for assessment of multiple abscesses increasing in number x 2 weeks after oral surgeon and a round of amoxicillin.

## 2019-09-11 ENCOUNTER — Ambulatory Visit (HOSPITAL_COMMUNITY)
Admission: EM | Admit: 2019-09-11 | Discharge: 2019-09-11 | Disposition: A | Discharge status: Home / Self Care | Payer: Medicaid Other | Attending: Urgent Care | Admitting: Urgent Care

## 2019-09-11 ENCOUNTER — Other Ambulatory Visit: Payer: Self-pay

## 2019-09-11 ENCOUNTER — Ambulatory Visit (INDEPENDENT_AMBULATORY_CARE_PROVIDER_SITE_OTHER): Payer: Medicaid Other

## 2019-09-11 ENCOUNTER — Encounter (HOSPITAL_COMMUNITY): Payer: Self-pay

## 2019-09-11 DIAGNOSIS — J45909 Unspecified asthma, uncomplicated: Secondary | ICD-10-CM | POA: Insufficient documentation

## 2019-09-11 DIAGNOSIS — R0602 Shortness of breath: Secondary | ICD-10-CM | POA: Insufficient documentation

## 2019-09-11 DIAGNOSIS — R05 Cough: Secondary | ICD-10-CM | POA: Diagnosis present

## 2019-09-11 DIAGNOSIS — J069 Acute upper respiratory infection, unspecified: Secondary | ICD-10-CM | POA: Insufficient documentation

## 2019-09-11 DIAGNOSIS — R0789 Other chest pain: Secondary | ICD-10-CM | POA: Insufficient documentation

## 2019-09-11 DIAGNOSIS — F17200 Nicotine dependence, unspecified, uncomplicated: Secondary | ICD-10-CM | POA: Insufficient documentation

## 2019-09-11 DIAGNOSIS — R059 Cough, unspecified: Secondary | ICD-10-CM

## 2019-09-11 DIAGNOSIS — Z20822 Contact with and (suspected) exposure to covid-19: Secondary | ICD-10-CM | POA: Insufficient documentation

## 2019-09-11 DIAGNOSIS — Z79899 Other long term (current) drug therapy: Secondary | ICD-10-CM | POA: Insufficient documentation

## 2019-09-11 MED ORDER — PROMETHAZINE-DM 6.25-15 MG/5ML PO SYRP: 5.0000 mL | ORAL_SOLUTION | Freq: Every evening | ORAL | 0 refills | Status: DC | PRN

## 2019-09-11 MED ORDER — BENZONATATE 100 MG PO CAPS: 100.0000 mg | ORAL_CAPSULE | Freq: Three times a day (TID) | ORAL | 0 refills | Status: DC | PRN

## 2019-09-11 MED ORDER — CETIRIZINE HCL 10 MG PO TABS: 10.0000 mg | ORAL_TABLET | Freq: Every day | ORAL | 0 refills | Status: DC

## 2019-09-11 NOTE — ED Triage Notes (Signed)
Pt states she has been out of states.pt states she has a cold , cough and chest congestion. Pt states she has been taking Mucinex and OTC cough meds. Pt states nothing is working.

## 2019-09-11 NOTE — Discharge Instructions (Signed)

## 2019-09-11 NOTE — ED Provider Notes (Signed)
[facility]   MRN: [id number] DOB: 09/08/1975  Subjective:   Pamela Lewis is a 44 y.o. female presenting for 5-day history of persistent and worsening malaise.  Symptoms started out with a runny stuffy nose and a cough.  She has progressed to having a productive cough, chest pain and intermittent shortness of breath.  Patient recently traveled to [place], her mother had similar symptoms and got sick when they got back on Wednesday of last week.  She has a history of bronchitis in the past.  Denies smoking cigarettes.  Denies history of chronic lung disorder.  She has gotten her Covid vaccination, completed last month.  No current facility-administered medications for this encounter.  Current Outpatient Medications:  .  Ginger, Zingiber officinalis, (GINGER PO), Take 1 tablet by mouth daily., Disp: , Rfl:  .  Multiple Vitamin (MULTIVITAMIN WITH MINERALS) TABS tablet, Take 1 tablet by mouth daily., Disp: , Rfl:  .  ondansetron (ZOFRAN ODT) 8 MG disintegrating tablet, Take 1 tablet (8 mg total) by mouth every 8 (eight) hours as needed for nausea or vomiting., Disp: 10 tablet, Rfl: 0 .  TURMERIC PO, Take 1 tablet by mouth daily., Disp: , Rfl:    Allergies  Allergen Reactions  . Other Hives, Shortness Of Breath and Swelling    Seafood Fruits with skin.    Past Medical History:  Diagnosis Date  . Anxiety   . Migraine      Past Surgical History:  Procedure Laterality Date  . BREAST REDUCTION SURGERY    . IRRIGATION AND DEBRIDEMENT ABSCESS Right 04/06/2013   Procedure: IRRIGATION AND Drainage RIGHT BREAST ABSCESS;  Surgeon: Wilmon Arms. Corliss Skains, MD;  Location: [facility];  Service: General;  Laterality: Right;    Family History  Problem Relation Age of Onset  . Diabetes Mother   . Hypertension Mother     Social History   Tobacco Use  . Smoking status: Current Every Day Smoker  . Smokeless tobacco: Never Used  Substance Use Topics  . Alcohol use: Yes  . Drug use: No    ROS    Objective:   Vitals: BP 132/80 (BP Location: Right Arm)   Pulse 100   Temp 98.9 F (37.2 C) (Oral)   Resp 18   Wt 239 lb 12.8 oz (108.8 kg)   LMP 09/11/2019   SpO2 100%   BMI 42.48 kg/m   Physical Exam Constitutional:      General: She is not in acute distress.    Appearance: Normal appearance. She is well-developed. She is obese. She is not ill-appearing, toxic-appearing or diaphoretic.  HENT:     Head: Normocephalic and atraumatic.     Right Ear: External ear normal.     Left Ear: External ear normal.     Nose: Nose normal.     Mouth/Throat:     Mouth: Mucous membranes are moist.  Eyes:     General: No scleral icterus.       Right eye: No discharge.        Left eye: No discharge.     Extraocular Movements: Extraocular movements intact.     Pupils: Pupils are equal, round, and reactive to light.  Cardiovascular:     Rate and Rhythm: Normal rate and regular rhythm.     Pulses: Normal pulses.     Heart sounds: Normal heart sounds. No murmur. No friction rub. No gallop.   Pulmonary:     Effort: Pulmonary effort is normal. No respiratory distress.  Breath sounds: No stridor. No wheezing, rhonchi or rales.     Comments: Decreased lung sounds mid-lower lung fields. Skin:    General: Skin is warm and dry.     Findings: No rash.  Neurological:     Mental Status: She is alert and oriented to person, place, and time.  Psychiatric:        Mood and Affect: Mood normal.        Behavior: Behavior normal.        Thought Content: Thought content normal.        Judgment: Judgment normal.     DG Chest 2 View  Result Date: 09/11/2019 CLINICAL DATA:  Cough, low-grade fever, chills, and chest pain, sore throat, negative COVID test x3, history bronchitis, asthma, smoking EXAM: CHEST - 2 VIEW COMPARISON:  01/12/2011 FINDINGS: Normal heart size, mediastinal contours, and pulmonary vascularity. Lungs clear. No pulmonary infiltrate, pleural effusion or pneumothorax. Osseous  structures unremarkable. IMPRESSION: No acute abnormalities. Electronically Signed   By: Lavonia Dana M.D.   On: 09/11/2019 11:34    Assessment and Plan :   PDMP not reviewed this encounter.  1. Cough   2. Atypical chest pain   3. Shortness of breath   4. Viral URI with cough     Will manage for viral illness such as viral URI, viral syndrome, viral rhinitis, COVID-19. Counseled patient on nature of COVID-19 including modes of transmission, diagnostic testing, management and supportive care.  Offered symptomatic relief. COVID 19 testing is pending. Counseled patient on potential for adverse effects with medications prescribed/recommended today, ER and return-to-clinic precautions discussed, patient verbalized understanding.     Jaynee Eagles, [place] 09/11/19 1207

## 2019-09-12 LAB — SARS CORONAVIRUS 2 (TAT 6-24 HRS): SARS Coronavirus 2: NEGATIVE

## 2019-11-17 ENCOUNTER — Encounter (HOSPITAL_COMMUNITY): Payer: Self-pay

## 2019-11-17 ENCOUNTER — Emergency Department (HOSPITAL_COMMUNITY): Payer: Self-pay

## 2019-11-17 ENCOUNTER — Emergency Department (HOSPITAL_COMMUNITY)
Admission: EM | Admit: 2019-11-17 | Discharge: 2019-11-17 | Disposition: A | Discharge status: Home / Self Care | Payer: Self-pay | Attending: Emergency Medicine | Admitting: Emergency Medicine

## 2019-11-17 DIAGNOSIS — Y999 Unspecified external cause status: Secondary | ICD-10-CM | POA: Insufficient documentation

## 2019-11-17 DIAGNOSIS — Y92009 Unspecified place in unspecified non-institutional (private) residence as the place of occurrence of the external cause: Secondary | ICD-10-CM | POA: Insufficient documentation

## 2019-11-17 DIAGNOSIS — W010XXA Fall on same level from slipping, tripping and stumbling without subsequent striking against object, initial encounter: Secondary | ICD-10-CM | POA: Insufficient documentation

## 2019-11-17 DIAGNOSIS — Y9301 Activity, walking, marching and hiking: Secondary | ICD-10-CM | POA: Insufficient documentation

## 2019-11-17 DIAGNOSIS — S62617A Displaced fracture of proximal phalanx of left little finger, initial encounter for closed fracture: Secondary | ICD-10-CM | POA: Insufficient documentation

## 2019-11-17 DIAGNOSIS — Z23 Encounter for immunization: Secondary | ICD-10-CM | POA: Insufficient documentation

## 2019-11-17 DIAGNOSIS — F17200 Nicotine dependence, unspecified, uncomplicated: Secondary | ICD-10-CM | POA: Insufficient documentation

## 2019-11-17 MED ORDER — TETANUS-DIPHTH-ACELL PERTUSSIS 5-2.5-18.5 LF-MCG/0.5 IM SUSP
0.5000 mL | Freq: Once | INTRAMUSCULAR | Status: AC
  Administered 2019-11-17: 0.5 mL via INTRAMUSCULAR
  Filled 2019-11-17: qty 0.5

## 2019-11-17 MED ORDER — LIDOCAINE HCL (PF) 1 % IJ SOLN
10.0000 mL | Freq: Once | INTRAMUSCULAR | Status: AC
  Administered 2019-11-17: 5 mL
  Filled 2019-11-17: qty 10

## 2019-11-17 MED ORDER — HYDROCODONE-ACETAMINOPHEN 5-325 MG PO TABS: 1.0000 | ORAL_TABLET | Freq: Four times a day (QID) | ORAL | 0 refills | Status: DC | PRN

## 2019-11-17 NOTE — ED Notes (Signed)
Patient stated she took some Motrin at this time for pain.

## 2019-11-17 NOTE — Discharge Instructions (Signed)
Please call Dr. Hinda Glatter office on Monday morning to schedule a follow up appointment Keep splint on and finger buddy taped to your ringer finger until you are seen by Dr. Roney Mans and his team  While at home please rest, ice, and elevate finger I have prescribed narcotic pain medication to take AS NEEDED for pain. I would recommend taking 800 mg Ibuprofen every 8 hours as needed for pain and if you have breakthrough pain to take the narcotic pain medication.  We have covered you with a tetanus shot that is valid for the next 10 years Return to the ED for any worsening symptoms including worsening pain, worsening swelling to finger, color change to finger including purple/black at the tip, redness/swelling/drainage of pus, fevers > 100.4, or any other new/concerning symptom s

## 2019-11-17 NOTE — ED Triage Notes (Signed)
Pt arrives to ED w/ c/o mechanical fall last night. Pt came down on her hand c/o L hand pain at ring and little finger. Pt reports 5/10 pain. Pt denies hitting head.

## 2019-11-17 NOTE — ED Provider Notes (Signed)
[facility] EMERGENCY DEPARTMENT Provider Note   CSN: [id number] Arrival date & time: 11/17/19  1226     History Chief Complaint  Patient presents with  . Hand Pain    Pamela Lewis is a 44 y.o. female who is R hand dominant presents to the ED today with complaint of sudden onset, constant, throbbing, left hand pain s/p mechanical fall that occurred last night around 10:30 PM.  Patient reports that she had been drinking a little bit last night and was walking back into her house when she tripped over a wooden ledge and fell.  She tried to catch herself on her outstretched hands when she felt immediate pain in her left hand.  She states the pain is most specifically in her fifth and fourth digits.  She states she is unable to bring her fifth digit back to the midline and next to her ring finger. She has been taking Ibuprofen at home with mild relief. No head injury or LOC with her fall. Pt has no other complaints currently.   The history is provided by the patient and medical records.       Past Medical History:  Diagnosis Date  . Anxiety   . Migraine     Patient Active Problem List   Diagnosis Date Noted  . Breast abscess of female 04/06/2013    Past Surgical History:  Procedure Laterality Date  . BREAST REDUCTION SURGERY    . IRRIGATION AND DEBRIDEMENT ABSCESS Right 04/06/2013   Procedure: IRRIGATION AND Drainage RIGHT BREAST ABSCESS;  Surgeon: Wilmon Arms. Corliss Skains, MD;  Location: [facility];  Service: General;  Laterality: Right;     OB History   No obstetric history on file.     Family History  Problem Relation Age of Onset  . Diabetes Mother   . Hypertension Mother     Social History   Tobacco Use  . Smoking status: Current Every Day Smoker  . Smokeless tobacco: Never Used  Substance Use Topics  . Alcohol use: Yes  . Drug use: No    Home Medications Prior to Admission medications   Medication Sig Start Date End Date Taking? Authorizing  Provider  benzonatate (TESSALON) 100 MG capsule Take 1-2 capsules (100-200 mg total) by mouth 3 (three) times daily as needed. 09/11/19   Wallis Bamberg, PA-C  cetirizine (ZYRTEC ALLERGY) 10 MG tablet Take 1 tablet (10 mg total) by mouth daily. 09/11/19   Wallis Bamberg, PA-C  Ginger, Zingiber officinalis, (GINGER PO) Take 1 tablet by mouth daily.    [provider]  HYDROcodone-acetaminophen (NORCO/VICODIN) 5-325 MG tablet Take 1 tablet by mouth every 6 (six) hours as needed for severe pain. 11/17/19   Tanda Rockers, PA-C  Multiple Vitamin (MULTIVITAMIN WITH MINERALS) TABS tablet Take 1 tablet by mouth daily.    [provider]  ondansetron (ZOFRAN ODT) 8 MG disintegrating tablet Take 1 tablet (8 mg total) by mouth every 8 (eight) hours as needed for nausea or vomiting. 06/17/17   Azalia Bilis, MD  promethazine-dextromethorphan (PROMETHAZINE-DM) 6.25-15 MG/5ML syrup Take 5 mLs by mouth at bedtime as needed for cough. 09/11/19   Wallis Bamberg, PA-C  TURMERIC PO Take 1 tablet by mouth daily.    [provider]    Allergies    Other  Review of Systems   Review of Systems  Constitutional: Negative for chills and fever.  Skin: Positive for color change.  Neurological: Negative for syncope and headaches.    Physical Exam Updated  Vital Signs BP (!) 135/79 (BP Location: Left Arm)   Pulse 86   Temp 97.8 F (36.6 C) (Oral)   Resp 16   Wt (!) 102.1 kg   LMP 11/03/2019 (Exact Date)   SpO2 100%   BMI 39.86 kg/m   Physical Exam Vitals and nursing note reviewed.  Constitutional:      Appearance: She is not ill-appearing.  HENT:     Head: Normocephalic and atraumatic.  Eyes:     Conjunctiva/sclera: Conjunctivae normal.  Cardiovascular:     Rate and Rhythm: Normal rate and regular rhythm.  Pulmonary:     Effort: Pulmonary effort is normal.     Breath sounds: Normal breath sounds.  Musculoskeletal:     Comments: Ecchymosis and edema noted to the medial aspect of L hand  specifically along the palmar aspect of the 4th and 5th digits; small abrasions noted to the PIP joints of both fingers; ROM limited to the 5th digit; pt able to fully extend finger however flexion limited to about 45 degrees of MCP, PIP, and DIP of 5th finger; abduction intact to 5th digit however pt unable to adduct finger to normal position; cap refill < 3 seconds to 4th and 5th fingers; sensation intact; 2+ radial pulse  Skin:    General: Skin is warm and dry.     Coloration: Skin is not jaundiced.  Neurological:     Mental Status: She is alert.     ED Results / Procedures / Treatments   Labs (all labs ordered are listed, but only abnormal results are displayed) Labs Reviewed - No data to display  EKG None  Radiology DG Hand Complete Left  Result Date: 11/17/2019 CLINICAL DATA:  Fall last night.  Small finger pain. EXAM: LEFT HAND - COMPLETE 3+ VIEW COMPARISON:  None. FINDINGS: There is an acute fracture of the 5th proximal phalangeal base which is mildly displaced and mildly angulated. This is in close proximity to the 5th metacarpal phalangeal joint, but no displacement of the articular surface or dislocation is seen. No other fractures are demonstrated. The joint spaces are preserved. There is no foreign body. IMPRESSION: Mildly displaced and angulated fracture of the 5th proximal phalanx without definite intra-articular extension. Electronically Signed   By: Carey Bullocks M.D.   On: 11/17/2019 14:00   DG Finger Little Left  Result Date: 11/17/2019 CLINICAL DATA:  Left fifth finger fracture, post reduction. EXAM: LEFT LITTLE FINGER 2+V COMPARISON:  1:11 p.m. FINDINGS: Three view radiograph of the left little finger demonstrates a transverse extra-articular fracture of the base of the left fifth proximal phalanx with 17 degrees lateral angulation of the distal fracture fragment. This appears reduced in severity since prior examination. No dislocation. Extensive surrounding soft tissue  swelling. IMPRESSION: Interval reduction in lateral angulation of angulated extra-articular proximal fifth phalanx fracture. Electronically Signed   By: Helyn Numbers MD   On: 11/17/2019 19:20    Procedures Reduction of dislocation  Date/Time: 11/17/2019 7:44 PM Performed by: Tanda Rockers, PA-C Authorized by: Tanda Rockers, PA-C  Preparation: Patient was prepped and draped in the usual sterile fashion. Local anesthesia used: yes Anesthesia: digital block  Anesthesia: Local anesthesia used: yes Local Anesthetic: lidocaine 1% without epinephrine Anesthetic total: 5 mL  Sedation: Patient sedated: no  Patient tolerance: patient tolerated the procedure well with no immediate complications    (including critical care time)  Medications Ordered in ED Medications  Tdap (BOOSTRIX) injection 0.5 mL (0.5 mLs Intramuscular Given 11/17/19 1839)  lidocaine (PF) (XYLOCAINE) 1 % injection 10 mL (5 mLs Infiltration Given 11/17/19 1839)    ED Course  I have reviewed the triage vital signs and the nursing notes.  Pertinent labs & imaging results that were available during my care of the patient were reviewed by me and considered in my medical decision making (see chart for details).    MDM Rules/Calculators/A&P                         44 year old female presents the ED today with complaint of left hand pain secondary to mechanical fall that occurred last night.  An x-ray was obtained while patient was in the waiting room which shows proximal phalanx fracture of the fifth digit on the left hand.  On my exam patient has pain/swelling/ecchymosis to this area.  She is unable to adduct her little finger to the ring finger.  Her range of motion including flexion is limited at MCP, PIP, DIP as well however suspect this is secondary to swelling.  She does have small abrasions noted to the PIP joint of both little and ring finger, not near the fracture, not indicative of an open fracture at this time.   She is unsure of tetanus status.  Will update in the ED.  We will plan to discuss case with hand surgery for further recommendations given limited range of motion.   Discussed case with hand surgeon Dr. Roney Mans who recommends digital block and trying to realign finger prior to splint.   Digital block performed with success, finger relocated and postop x-ray obtained which does show interval reduction.  Finger splinted at this time.  Patient instructed to follow-up with Dr. Roney Mans next week for further evaluation.  Have prescribed short course of pain medication to take as needed.  Strict return precautions have been discussed with patient.  Rice therapy discussed.  She is in agreement with plan and stable for discharge.   This note was prepared using Dragon voice recognition software and may include unintentional dictation errors due to the inherent limitations of voice recognition software.  Final Clinical Impression(s) / ED Diagnoses Final diagnoses:  Closed displaced fracture of proximal phalanx of left little finger, initial encounter    Rx / DC Orders ED Discharge Orders         Ordered    HYDROcodone-acetaminophen (NORCO/VICODIN) 5-325 MG tablet  Every 6 hours PRN     Discontinue  Reprint     11/17/19 1942           Discharge Instructions     Please call Dr. Hinda Glatter office on Monday morning to schedule a follow up appointment Keep splint on and finger buddy taped to your ringer finger until you are seen by Dr. Roney Mans and his team  While at home please rest, ice, and elevate finger I have prescribed narcotic pain medication to take AS NEEDED for pain. I would recommend taking 800 mg Ibuprofen every 8 hours as needed for pain and if you have breakthrough pain to take the narcotic pain medication.  We have covered you with a tetanus shot that is valid for the next 10 years Return to the ED for any worsening symptoms including worsening pain, worsening swelling to  finger, color change to finger including purple/black at the tip, redness/swelling/drainage of pus, fevers > 100.4, or any other new/concerning symptom s       Milinda Antis 11/17/19 1946    Jacalyn Lefevre, MD 11/17/19  2333  

## 2020-02-12 ENCOUNTER — Encounter (HOSPITAL_COMMUNITY): Payer: Self-pay

## 2020-02-12 ENCOUNTER — Ambulatory Visit (HOSPITAL_COMMUNITY)
Admission: EM | Admit: 2020-02-12 | Discharge: 2020-02-12 | Disposition: A | Discharge status: Home / Self Care | Payer: Self-pay

## 2020-02-12 ENCOUNTER — Other Ambulatory Visit: Payer: Self-pay

## 2020-02-12 DIAGNOSIS — J40 Bronchitis, not specified as acute or chronic: Secondary | ICD-10-CM

## 2020-02-12 DIAGNOSIS — Z833 Family history of diabetes mellitus: Secondary | ICD-10-CM | POA: Insufficient documentation

## 2020-02-12 DIAGNOSIS — Z8249 Family history of ischemic heart disease and other diseases of the circulatory system: Secondary | ICD-10-CM | POA: Insufficient documentation

## 2020-02-12 DIAGNOSIS — Z20822 Contact with and (suspected) exposure to covid-19: Secondary | ICD-10-CM | POA: Insufficient documentation

## 2020-02-12 DIAGNOSIS — Z79899 Other long term (current) drug therapy: Secondary | ICD-10-CM | POA: Insufficient documentation

## 2020-02-12 DIAGNOSIS — R519 Headache, unspecified: Secondary | ICD-10-CM | POA: Insufficient documentation

## 2020-02-12 DIAGNOSIS — F17200 Nicotine dependence, unspecified, uncomplicated: Secondary | ICD-10-CM | POA: Insufficient documentation

## 2020-02-12 LAB — SARS CORONAVIRUS 2 (TAT 6-24 HRS): SARS Coronavirus 2: NEGATIVE

## 2020-02-12 MED ORDER — CETIRIZINE HCL 10 MG PO TABS: 10.0000 mg | ORAL_TABLET | Freq: Every day | ORAL | 1 refills | Status: DC

## 2020-02-12 MED ORDER — AZITHROMYCIN 250 MG PO TABS
250.0000 mg | ORAL_TABLET | Freq: Every day | ORAL | 0 refills | Status: DC
Start: 2020-02-12 — End: 2020-03-05

## 2020-02-12 MED ORDER — ALBUTEROL SULFATE HFA 108 (90 BASE) MCG/ACT IN AERS: 1.0000 | INHALATION_SPRAY | Freq: Four times a day (QID) | RESPIRATORY_TRACT | 0 refills | Status: DC | PRN

## 2020-02-12 MED ORDER — FLUCONAZOLE 150 MG PO TABS
150.0000 mg | ORAL_TABLET | Freq: Every day | ORAL | 0 refills | Status: DC
Start: 2020-02-12 — End: 2020-03-05

## 2020-02-12 MED ORDER — BENZONATATE 100 MG PO CAPS: 100.0000 mg | ORAL_CAPSULE | Freq: Three times a day (TID) | ORAL | 0 refills | Status: DC | PRN

## 2020-02-12 NOTE — ED Provider Notes (Signed)
[facility]    CSN: [id number] Arrival date & time: 02/12/20  0813      History   Chief Complaint Chief Complaint  Patient presents with  . Cough  . Headache    HPI Pamela Lewis is a 44 y.o. female.   Patient is a 44 year old female presents today with productive cough, chest tightness, mild shortness of breath.  This is been present worsening over the past week.  Has been using leftover Tessalon Perles for cough.  Reporting history of bronchitis.  Has been taking Tylenol and ibuprofen for the headache.  Mild nasal congestion and patient is a drip.  Requesting Covid test due to being exposed to Covid approximate 2 weeks ago.  No fevers, chills     Past Medical History:  Diagnosis Date  . Anxiety   . Migraine     Patient Active Problem List   Diagnosis Date Noted  . Breast abscess of female 04/06/2013    Past Surgical History:  Procedure Laterality Date  . BREAST REDUCTION SURGERY    . IRRIGATION AND DEBRIDEMENT ABSCESS Right 04/06/2013   Procedure: IRRIGATION AND Drainage RIGHT BREAST ABSCESS;  Surgeon: Wilmon Arms. Corliss Skains, MD;  Location: [facility];  Service: General;  Laterality: Right;    OB History   No obstetric history on file.      Home Medications    Prior to Admission medications   Medication Sig Start Date End Date Taking? Authorizing Provider  ELDERBERRY PO Take by mouth.   Yes [provider]  albuterol (VENTOLIN HFA) 108 (90 Base) MCG/ACT inhaler Inhale 1-2 puffs into the lungs every 6 (six) hours as needed for wheezing or shortness of breath. 02/12/20   Javohn Basey, Gloris Manchester A, NP  azithromycin (ZITHROMAX) 250 MG tablet Take 1 tablet (250 mg total) by mouth daily. Take first 2 tablets together, then 1 every day until finished. 02/12/20   Abie Cheek, Gloris Manchester A, NP  benzonatate (TESSALON) 100 MG capsule Take 1-2 capsules (100-200 mg total) by mouth 3 (three) times daily as needed. 02/12/20   Dahlia Byes A, NP  cetirizine (ZYRTEC ALLERGY) 10 MG tablet  Take 1 tablet (10 mg total) by mouth daily. 02/12/20   Dahlia Byes A, NP  fluconazole (DIFLUCAN) 150 MG tablet Take 1 tablet (150 mg total) by mouth daily. 02/12/20   Dahlia Byes A, NP  Ginger, Zingiber officinalis, (GINGER PO) Take 1 tablet by mouth daily.    [provider]  Multiple Vitamin (MULTIVITAMIN WITH MINERALS) TABS tablet Take 1 tablet by mouth daily.    [provider]  TURMERIC PO Take 1 tablet by mouth daily.    [provider]    Family History Family History  Problem Relation Age of Onset  . Diabetes Mother   . Hypertension Mother     Social History Social History   Tobacco Use  . Smoking status: Current Every Day Smoker  . Smokeless tobacco: Never Used  Substance Use Topics  . Alcohol use: Yes  . Drug use: No     Allergies   Other   Review of Systems Review of Systems   Physical Exam Triage Vital Signs ED Triage Vitals  Enc Vitals Group     BP 02/12/20 0829 130/83     Pulse Rate 02/12/20 0829 88     Resp 02/12/20 0829 20     Temp 02/12/20 0829 98.6 F (37 C)     Temp Source 02/12/20 0829 Oral     SpO2 02/12/20 0829  97 %     Weight --      Height --      Head Circumference --      Peak Flow --      Pain Score 02/12/20 0831 6     Pain Loc --      Pain Edu? --      Excl. in GC? --    No data found.  Updated Vital Signs BP 130/83 (BP Location: Right Arm)   Pulse 88   Temp 98.6 F (37 C) (Oral)   Resp 20   LMP  (Within Weeks) Comment: 2 weeks  SpO2 97%   Visual Acuity Right Eye Distance:   Left Eye Distance:   Bilateral Distance:    Right Eye Near:   Left Eye Near:    Bilateral Near:     Physical Exam Vitals and nursing note reviewed.  Constitutional:      General: She is not in acute distress.    Appearance: Normal appearance. She is not ill-appearing, toxic-appearing or diaphoretic.  HENT:     Head: Normocephalic.     Nose: Nose normal.  Eyes:     Conjunctiva/sclera: Conjunctivae normal.    Cardiovascular:     Rate and Rhythm: Normal rate and regular rhythm.     Pulses: Normal pulses.     Heart sounds: Normal heart sounds.  Pulmonary:     Effort: Pulmonary effort is normal.     Comments: Decreased lung sounds throughout Musculoskeletal:        General: Normal range of motion.     Cervical back: Normal range of motion.  Skin:    General: Skin is warm and dry.     Findings: No rash.  Neurological:     Mental Status: She is alert.  Psychiatric:        Mood and Affect: Mood normal.      UC Treatments / Results  Labs (all labs ordered are listed, but only abnormal results are displayed) Labs Reviewed  SARS CORONAVIRUS 2 (TAT 6-24 HRS)    EKG   Radiology No results found.  Procedures Procedures (including critical care time)  Medications Ordered in UC Medications - No data to display  Initial Impression / Assessment and Plan / UC Course  I have reviewed the triage vital signs and the nursing notes.  Pertinent labs & imaging results that were available during my care of the patient were reviewed by me and considered in my medical decision making (see chart for details).     Bronchitis, may be allergy induced, zyrtec daily. Albuterol as needed.  Will go ahead and cover for bacterial based on length of symptoms and feeling worse.  Medication as prescribed covid test pending.  Follow up as needed for continued or worsening symptoms  Final Clinical Impressions(s) / UC Diagnoses   Final diagnoses:  Bronchitis     Discharge Instructions     Treating you for bronchitis  Medication as prescribed Covid test pending.  Follow up as needed for continued or worsening symptoms     ED Prescriptions    Medication Sig Dispense Auth. Provider   benzonatate (TESSALON) 100 MG capsule Take 1-2 capsules (100-200 mg total) by mouth 3 (three) times daily as needed. 60 capsule Mykeisha Dysert A, NP   cetirizine (ZYRTEC ALLERGY) 10 MG tablet Take 1 tablet (10 mg  total) by mouth daily. 30 tablet Nolyn Eilert A, NP   azithromycin (ZITHROMAX) 250 MG tablet Take 1 tablet (250 mg  total) by mouth daily. Take first 2 tablets together, then 1 every day until finished. 6 tablet Cecylia Brazill A, NP   fluconazole (DIFLUCAN) 150 MG tablet Take 1 tablet (150 mg total) by mouth daily. 2 tablet Delanee Xin A, NP   albuterol (VENTOLIN HFA) 108 (90 Base) MCG/ACT inhaler Inhale 1-2 puffs into the lungs every 6 (six) hours as needed for wheezing or shortness of breath. 1 each Janace Aris, NP     PDMP not reviewed this encounter.   Janace Aris, NP 02/12/20 [phone]

## 2020-02-12 NOTE — Discharge Instructions (Addendum)
Treating you for bronchitis  Medication as prescribed Covid test pending.  Follow up as needed for continued or worsening symptoms

## 2020-02-12 NOTE — ED Triage Notes (Signed)
Pt presents with cough, chest tightness and headache x 1 week. States the chest tightness fells "like when I have bronchitis". 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 Pt requested COVID test.

## 2020-03-05 ENCOUNTER — Ambulatory Visit (HOSPITAL_COMMUNITY)
Admission: EM | Admit: 2020-03-05 | Discharge: 2020-03-05 | Disposition: A | Discharge status: Home / Self Care | Payer: Self-pay | Attending: Internal Medicine | Admitting: Internal Medicine

## 2020-03-05 ENCOUNTER — Other Ambulatory Visit: Payer: Self-pay

## 2020-03-05 ENCOUNTER — Ambulatory Visit (INDEPENDENT_AMBULATORY_CARE_PROVIDER_SITE_OTHER): Payer: Self-pay

## 2020-03-05 ENCOUNTER — Encounter (HOSPITAL_COMMUNITY): Payer: Self-pay

## 2020-03-05 DIAGNOSIS — R059 Cough, unspecified: Secondary | ICD-10-CM

## 2020-03-05 DIAGNOSIS — J40 Bronchitis, not specified as acute or chronic: Secondary | ICD-10-CM

## 2020-03-05 MED ORDER — FLUTICASONE PROPIONATE 50 MCG/ACT NA SUSP: 2.0000 | Freq: Every day | NASAL | 0 refills | Status: AC

## 2020-03-05 MED ORDER — PREDNISONE 20 MG PO TABS: 20.0000 mg | ORAL_TABLET | Freq: Every day | ORAL | 0 refills | Status: DC

## 2020-03-05 MED ORDER — FLUCONAZOLE 150 MG PO TABS
ORAL_TABLET | ORAL | 0 refills | Status: DC
Start: 2020-03-05 — End: 2020-11-28

## 2020-03-05 MED ORDER — DOXYCYCLINE HYCLATE 100 MG PO CAPS
100.0000 mg | ORAL_CAPSULE | Freq: Two times a day (BID) | ORAL | 0 refills | Status: DC
Start: 2020-03-05 — End: 2020-11-28

## 2020-03-05 NOTE — ED Triage Notes (Signed)
Pt c/o recurrent productive cough with yellow sputum, congestion, runny nose for approx 2.5 weeks. Pt states she was evaluated and tx for same 2.5 weeks ago and cough persists. Also reports mild SOBOE and using inhaler.  Had COVID booster approx 2.5 weeks ago.   Last took inhaler in waiting room.  Denies fever, n/v/d, body aches, sore throat, ear pain. Bilateral lung sounds CTA.

## 2020-03-05 NOTE — ED Provider Notes (Signed)
[facility]    CSN: [id number] Arrival date & time: 03/05/20  0806      History   Chief Complaint Chief Complaint  Patient presents with   Cough    HPI Pamela Lewis is a 44 y.o. female who presents with persistent productive cough with yellow sputum, mild SOB and rhinitis for the past 3 weeks. Was seen here for Bronchitis on 10/19 and was placed no Z-pack  And Zyrtecwhich helped while on it, but when done she started coughing again and last night felt a lot of PND and tickle in her throat which caused her to cough more. She started wheezing this am and used her inhaler in the waiting room. She denies fever, body aches, N/V/D, ST, or ear pain. She smoked only twice while sick.  She had Covid injection booster 2.5 weeks ago.    Past Medical History:  Diagnosis Date   Anxiety    Migraine     Patient Active Problem List   Diagnosis Date Noted   Breast abscess of female 04/06/2013    Past Surgical History:  Procedure Laterality Date   BREAST REDUCTION SURGERY     IRRIGATION AND DEBRIDEMENT ABSCESS Right 04/06/2013   Procedure: IRRIGATION AND Drainage RIGHT BREAST ABSCESS;  Surgeon: Wilmon Arms. Corliss Skains, MD;  Location: [facility];  Service: General;  Laterality: Right;    OB History   No obstetric history on file.      Home Medications    Prior to Admission medications   Medication Sig Start Date End Date Taking? Authorizing Provider  albuterol (VENTOLIN HFA) 108 (90 Base) MCG/ACT inhaler Inhale 1-2 puffs into the lungs every 6 (six) hours as needed for wheezing or shortness of breath. 02/12/20  Yes Bast, Traci A, NP  benzonatate (TESSALON) 100 MG capsule Take 1-2 capsules (100-200 mg total) by mouth 3 (three) times daily as needed. 02/12/20  Yes Bast, Traci A, NP  cetirizine (ZYRTEC ALLERGY) 10 MG tablet Take 1 tablet (10 mg total) by mouth daily. 02/12/20  Yes Bast, Traci A, NP  ELDERBERRY PO Take by mouth.   Yes [provider]  Ginger, Zingiber  officinalis, (GINGER PO) Take 1 tablet by mouth daily.   Yes [provider]  Multiple Vitamin (MULTIVITAMIN WITH MINERALS) TABS tablet Take 1 tablet by mouth daily.   Yes [provider]  TURMERIC PO Take 1 tablet by mouth daily.   Yes [provider]  doxycycline (VIBRAMYCIN) 100 MG capsule Take 1 capsule (100 mg total) by mouth 2 (two) times daily. 03/05/20   Rodriguez-Southworth, Nettie Elm, PA-C  fluconazole (DIFLUCAN) 150 MG tablet Take one now, and one when done with the antibiotic 03/05/20   Rodriguez-Southworth, Nettie Elm, PA-C  fluticasone [facility]) 50 MCG/ACT nasal spray Place 2 sprays into both nostrils daily. 03/05/20   Rodriguez-Southworth, Nettie Elm, PA-C  predniSONE (DELTASONE) 20 MG tablet Take 1 tablet (20 mg total) by mouth daily with breakfast. 03/05/20   Rodriguez-Southworth, Nettie Elm, PA-C    Family History Family History  Problem Relation Age of Onset   Diabetes Mother    Hypertension Mother     Social History Social History   Tobacco Use   Smoking status: Current Every Day Smoker   Smokeless tobacco: Never Used  Substance Use Topics   Alcohol use: Yes   Drug use: No     Allergies   Other   Review of Systems Review of Systems  Constitutional: Negative for activity change, appetite change, chills, diaphoresis, fatigue and fever.  HENT: Positive for congestion, postnasal drip, rhinorrhea and sneezing. Negative for ear discharge, ear pain, sore throat, trouble swallowing and voice change.   Eyes: Negative for discharge.  Respiratory: Positive for cough, shortness of breath and wheezing. Negative for chest tightness.   Cardiovascular: Negative for chest pain.  Gastrointestinal: Negative for nausea and vomiting.  Musculoskeletal: Negative for myalgias.  Skin: Negative for rash.  Neurological: Negative for headaches.  Hematological: Negative for adenopathy.     Physical Exam Triage Vital Signs ED Triage Vitals  Enc Vitals Group       BP 03/05/20 0831 (!) 158/113     Pulse Rate 03/05/20 0831 85     Resp 03/05/20 0831 17     Temp 03/05/20 0831 98.5 F (36.9 C)     Temp Source 03/05/20 0831 Oral     SpO2 03/05/20 0831 100 %     Weight --      Height --      Head Circumference --      Peak Flow --      Pain Score 03/05/20 0828 0     Pain Loc --      Pain Edu? --      Excl. in GC? --    No data found.  Updated Vital Signs BP 136/81 (BP Location: Left Arm)    Pulse 85    Temp 98.5 F (36.9 C) (Oral)    Resp 17    LMP 02/21/2020 (Approximate)    SpO2 100%   Visual Acuity Right Eye Distance:   Left Eye Distance:   Bilateral Distance:    Right Eye Near:   Left Eye Near:    Bilateral Near:     Physical Exam Vitals and nursing note reviewed.  Constitutional:      General: She is not in acute distress.    Appearance: She is obese. She is not toxic-appearing.  HENT:     Head: Normocephalic.     Right Ear: Tympanic membrane, ear canal and external ear normal.     Left Ear: Tympanic membrane, ear canal and external ear normal.     Nose: Rhinorrhea present.     Mouth/Throat:     Mouth: Mucous membranes are moist.     Pharynx: Oropharynx is clear.  Eyes:     General: No scleral icterus.    Conjunctiva/sclera: Conjunctivae normal.  Cardiovascular:     Rate and Rhythm: Normal rate and regular rhythm.     Heart sounds: No murmur heard.   Pulmonary:     Effort: Pulmonary effort is normal.     Breath sounds: Normal breath sounds.  Musculoskeletal:        General: Normal range of motion.     Cervical back: Neck supple.  Lymphadenopathy:     Cervical: No cervical adenopathy.  Skin:    General: Skin is warm and dry.  Neurological:     Mental Status: She is alert and oriented to person, place, and time.     Gait: Gait normal.  Psychiatric:        Mood and Affect: Mood normal.        Behavior: Behavior normal.        Thought Content: Thought content normal.        Judgment: Judgment normal.    UC  Treatments / Results  Labs (all labs ordered are listed, but only abnormal results are displayed) Labs Reviewed - No data to display  EKG   Radiology DG Chest  2 View  Result Date: 03/05/2020 CLINICAL DATA:  44 year old female with cough EXAM: CHEST - 2 VIEW COMPARISON:  09/11/2019 FINDINGS: Cardiomediastinal silhouette unchanged in size and contour. No pneumothorax. No pleural effusion. No confluent airspace disease. No acute displaced fracture. IMPRESSION: Negative for acute cardiopulmonary disease Electronically Signed   By: Gilmer Mor D.O.   On: 03/05/2020 08:56    Procedures Procedures (including critical care time)  Medications Ordered in UC Medications - No data to display  Initial Impression / Assessment and Plan / UC Course  I have reviewed the triage vital signs and the nursing notes. Has unresolved bronchitis and I placed her on Doxy, prednisone for 5 days and Flonase to help her post nasal drainage as noted. May continue Zyrtec and Albuterol inhaler.  Pertinent  imaging results that were available during my care of the patient were reviewed by me and considered in my medical decision making (see chart for details).   Final Clinical Impressions(s) / UC Diagnoses   Final diagnoses:  Bronchitis     Discharge Instructions     Continue the Zyrtec and inhaler as needed I sent Flonase to help with post nasal drainage and Doxycycline which helps smokers bronchitis better.  Continue working in quit smoking, even socially    ED Prescriptions    Medication Sig Dispense Auth. Provider   fluticasone (FLONASE) 50 MCG/ACT nasal spray Place 2 sprays into both nostrils daily. 18.2 g Rodriguez-Southworth, Nettie Elm, PA-C   doxycycline (VIBRAMYCIN) 100 MG capsule Take 1 capsule (100 mg total) by mouth 2 (two) times daily. 20 capsule Rodriguez-Southworth, Nettie Elm, PA-C   predniSONE (DELTASONE) 20 MG tablet Take 1 tablet (20 mg total) by mouth daily with breakfast. 5 tablet  Rodriguez-Southworth, Nettie Elm, PA-C   fluconazole (DIFLUCAN) 150 MG tablet Take one now, and one when done with the antibiotic 2 tablet Rodriguez-Southworth, Nettie Elm, PA-C     PDMP not reviewed this encounter.   Garey Ham, [place] 03/05/20 1208

## 2020-03-05 NOTE — Discharge Instructions (Signed)
Continue the Zyrtec and inhaler as needed I sent Flonase to help with post nasal drainage and Doxycycline which helps smokers bronchitis better.  Continue working in quit smoking, even socially

## 2020-07-21 ENCOUNTER — Ambulatory Visit (HOSPITAL_COMMUNITY)
Admission: EM | Admit: 2020-07-21 | Discharge: 2020-07-21 | Disposition: A | Discharge status: Home / Self Care | Payer: BC Managed Care – PPO | Attending: Emergency Medicine | Admitting: Emergency Medicine

## 2020-07-21 ENCOUNTER — Encounter (HOSPITAL_COMMUNITY): Payer: Self-pay

## 2020-07-21 ENCOUNTER — Other Ambulatory Visit: Payer: Self-pay

## 2020-07-21 DIAGNOSIS — S86912A Strain of unspecified muscle(s) and tendon(s) at lower leg level, left leg, initial encounter: Secondary | ICD-10-CM | POA: Diagnosis not present

## 2020-07-21 DIAGNOSIS — M25562 Pain in left knee: Secondary | ICD-10-CM

## 2020-07-21 MED ORDER — NAPROXEN 500 MG PO TABS
500.0000 mg | ORAL_TABLET | Freq: Two times a day (BID) | ORAL | 0 refills | Status: AC
Start: 2020-07-21 — End: 2020-07-28

## 2020-07-21 NOTE — ED Triage Notes (Signed)
Pt in with c/o left knee pain that has been going on since Thursday  States it feels like she tore something or pulled a muscle  Pt has been wearing knee brace and taking tylenol and ibuprofen with no relief

## 2020-07-21 NOTE — Discharge Instructions (Addendum)
Take the Naproxen twice a day for 7 days.  Wear the ace bandage.   Use RICE:  RICE: Rest Ice for 10-15 minutes every 4-6 hours as needed for pain and swelling Compression (ace bandage) Elevation   Return or go to the Emergency Department if symptoms worsen or do not improve in the next few days.

## 2020-09-14 ENCOUNTER — Encounter (HOSPITAL_COMMUNITY): Payer: Self-pay

## 2020-09-14 ENCOUNTER — Other Ambulatory Visit: Payer: Self-pay

## 2020-09-14 ENCOUNTER — Ambulatory Visit (HOSPITAL_COMMUNITY)
Admission: EM | Admit: 2020-09-14 | Discharge: 2020-09-14 | Disposition: A | Discharge status: Home / Self Care | Payer: BC Managed Care – PPO | Attending: Family Medicine | Admitting: Family Medicine

## 2020-09-14 DIAGNOSIS — Z20822 Contact with and (suspected) exposure to covid-19: Secondary | ICD-10-CM | POA: Diagnosis not present

## 2020-09-14 DIAGNOSIS — J029 Acute pharyngitis, unspecified: Secondary | ICD-10-CM | POA: Insufficient documentation

## 2020-09-14 DIAGNOSIS — R490 Dysphonia: Secondary | ICD-10-CM | POA: Insufficient documentation

## 2020-09-14 DIAGNOSIS — R21 Rash and other nonspecific skin eruption: Secondary | ICD-10-CM | POA: Diagnosis not present

## 2020-09-14 DIAGNOSIS — F17200 Nicotine dependence, unspecified, uncomplicated: Secondary | ICD-10-CM | POA: Insufficient documentation

## 2020-09-14 DIAGNOSIS — R059 Cough, unspecified: Secondary | ICD-10-CM | POA: Insufficient documentation

## 2020-09-14 LAB — SARS CORONAVIRUS 2 (TAT 6-24 HRS): SARS Coronavirus 2: NEGATIVE

## 2020-09-14 MED ORDER — TRIAMCINOLONE ACETONIDE 0.1 % EX CREA
1.0000 | TOPICAL_CREAM | Freq: Two times a day (BID) | CUTANEOUS | 1 refills | Status: DC | PRN
Start: 2020-09-14 — End: 2023-05-22

## 2020-09-14 MED ORDER — PREDNISONE 10 MG PO TABS: ORAL_TABLET | ORAL | 0 refills | Status: DC

## 2020-09-14 NOTE — ED Triage Notes (Signed)
Pt reports sore throat, rash and cough x 2 days. Pt states she had a bad allergic reaction to shrimp when she was a kid, and she think the rash can be relate to a pice of shrimp she ate at the [facility]. Denies fever, chills. Tylenol and Benadryl gives relief.

## 2020-09-14 NOTE — ED Provider Notes (Signed)
[facility]    CSN: [id number] Arrival date & time: 09/14/20  1009      History   Chief Complaint Chief Complaint  Patient presents with  . Rash  . Cough  . Sore Throat    HPI Pamela Lewis is a 45 y.o. female.   Presenting today with 3-day history of progressively worsening itchy papular rash which started as 1 bump on the left eyelid and one bump on the left shoulder but has not progressed to clusters of bumps across all 4 extremities, face, chest.  The bumps are extremely itchy and some look to be almost fluid-filled per patient.  She has been avoiding scratching them for fear of causing or spread.  She also states shortly after the rash began having a scratchy throat and a hoarse cough.  She denies fever, chills, chest pain, shortness of breath, abdominal pain, nausea vomiting or diarrhea.  Recently went on vacation with multiple coworkers to [facility], they all slept in the same condo and the same room, went to the same [place] and excursions and no one else has any rashes.  She notes she tried 1 shrimp on vacation which she has had a reaction to in the past but the rash did not start until the next day.  She states the reaction was much different last time when she reacted to shellfish.  Took a Benadryl and some Tylenol last night which did help her sleep but did not improve the condition at all.     Past Medical History:  Diagnosis Date  . Anxiety   . Migraine     Patient Active Problem List   Diagnosis Date Noted  . Breast abscess of female 04/06/2013    Past Surgical History:  Procedure Laterality Date  . BREAST REDUCTION SURGERY    . IRRIGATION AND DEBRIDEMENT ABSCESS Right 04/06/2013   Procedure: IRRIGATION AND Drainage RIGHT BREAST ABSCESS;  Surgeon: Wilmon Arms. Corliss Skains, MD;  Location: [facility];  Service: General;  Laterality: Right;    OB History   No obstetric history on file.      Home Medications    Prior to Admission medications    Medication Sig Start Date End Date Taking? Authorizing Provider  predniSONE (DELTASONE) 10 MG tablet Take 6 tabs daily x 2 days, 5 tabs daily x 2 days, 4 tabs daily x 2 days, etc 09/14/20  Yes Particia Nearing, PA-C  triamcinolone cream (KENALOG) 0.1 % Apply 1 application topically 2 (two) times daily as needed. Avoid use on the face 09/14/20  Yes Particia Nearing, PA-C  albuterol (VENTOLIN HFA) 108 (90 Base) MCG/ACT inhaler Inhale 1-2 puffs into the lungs every 6 (six) hours as needed for wheezing or shortness of breath. 02/12/20   Bast, Gloris [place] A, NP  benzonatate (TESSALON) 100 MG capsule Take 1-2 capsules (100-200 mg total) by mouth 3 (three) times daily as needed. 02/12/20   Dahlia Byes A, NP  cetirizine (ZYRTEC ALLERGY) 10 MG tablet Take 1 tablet (10 mg total) by mouth daily. 02/12/20   Dahlia Byes A, NP  doxycycline (VIBRAMYCIN) 100 MG capsule Take 1 capsule (100 mg total) by mouth 2 (two) times daily. 03/05/20   Rodriguez-Southworth, Nettie Elm, PA-C  ELDERBERRY PO Take by mouth.    [provider]  fluconazole (DIFLUCAN) 150 MG tablet Take one now, and one when done with the antibiotic 03/05/20   Rodriguez-Southworth, Nettie Elm, PA-C  fluticasone (FLONASE) 50 MCG/ACT nasal spray Place 2 sprays into both nostrils daily.  03/05/20   Rodriguez-Southworth, Nettie Elm, PA-C  Ginger, Zingiber officinalis, (GINGER PO) Take 1 tablet by mouth daily.    [provider]  Multiple Vitamin (MULTIVITAMIN WITH MINERALS) TABS tablet Take 1 tablet by mouth daily.    [provider]  TURMERIC PO Take 1 tablet by mouth daily.    [provider]    Family History Family History  Problem Relation Age of Onset  . Diabetes Mother   . Hypertension Mother     Social History Social History   Tobacco Use  . Smoking status: Current Every Day Smoker  . Smokeless tobacco: Never Used  Substance Use Topics  . Alcohol use: Yes  . Drug use: No     Allergies    Other   Review of Systems Review of Systems Per HPI  Physical Exam Triage Vital Signs ED Triage Vitals  Enc Vitals Group     BP 09/14/20 1059 (!) 141/89     Pulse Rate 09/14/20 1059 86     Resp 09/14/20 1059 18     Temp 09/14/20 1059 98.2 F (36.8 C)     Temp Source 09/14/20 1059 Oral     SpO2 09/14/20 1059 100 %     Weight --      Height --      Head Circumference --      Peak Flow --      Pain Score 09/14/20 1058 5     Pain Loc --      Pain Edu? --      Excl. in GC? --    No data found.  Updated Vital Signs BP (!) 141/89 (BP Location: Left Arm)   Pulse 86   Temp 98.2 F (36.8 C) (Oral)   Resp 18   SpO2 100%   Visual Acuity Right Eye Distance:   Left Eye Distance:   Bilateral Distance:    Right Eye Near:   Left Eye Near:    Bilateral Near:     Physical Exam Vitals and nursing note reviewed.  Constitutional:      Appearance: Normal appearance. She is not ill-appearing.  HENT:     Head: Atraumatic.     Right Ear: Tympanic membrane normal.     Left Ear: Tympanic membrane normal.     Nose: Nose normal.     Comments: Nasal mucosa mildly erythematous and edematous bilaterally    Mouth/Throat:     Mouth: Mucous membranes are moist.     Pharynx: Oropharynx is clear. Posterior oropharyngeal erythema present. No oropharyngeal exudate.  Eyes:     Extraocular Movements: Extraocular movements intact.     Conjunctiva/sclera: Conjunctivae normal.  Cardiovascular:     Rate and Rhythm: Normal rate and regular rhythm.     Heart sounds: Normal heart sounds.  Pulmonary:     Effort: Pulmonary effort is normal.     Breath sounds: Normal breath sounds.  Abdominal:     General: Bowel sounds are normal. There is no distension.     Palpations: Abdomen is soft.     Tenderness: There is no abdominal tenderness. There is no guarding.  Musculoskeletal:        General: Normal range of motion.     Cervical back: Normal range of motion and neck supple.  Skin:    General:  Skin is warm and dry.     Findings: Rash present.     Comments: Erythematous papular rash diffusely across extremities, chest, face, many in clusters.  Neurological:  Mental Status: She is alert and oriented to person, place, and time.  Psychiatric:        Mood and Affect: Mood normal.        Thought Content: Thought content normal.        Judgment: Judgment normal.    UC Treatments / Results  Labs (all labs ordered are listed, but only abnormal results are displayed) Labs Reviewed  SARS CORONAVIRUS 2 (TAT 6-24 HRS)    EKG   Radiology No results found.  Procedures Procedures (including critical care time)  Medications Ordered in UC Medications - No data to display  Initial Impression / Assessment and Plan / UC Course  I have reviewed the triage vital signs and the nursing notes.  Pertinent labs & imaging results that were available during my care of the patient were reviewed by me and considered in my medical decision making (see chart for details).     Timeline not altogether consistent with a food allergy reaction, more suspected environmental allergic exposure.  Exam more consistent with bedbugs but no one else that she stayed with has any of these bumps.  We will perform COVID testing today to rule out given her sore throat and cough, start extended prednisone taper for the itchy rash with triamcinolone for spot treatment.  Antihistamines twice daily additionally.  Follow-up with primary care early next week for recheck.  Final Clinical Impressions(s) / UC Diagnoses   Final diagnoses:  Rash and nonspecific skin eruption  Cough  Sore throat   Discharge Instructions   None    ED Prescriptions    Medication Sig Dispense Auth. Provider   predniSONE (DELTASONE) 10 MG tablet Take 6 tabs daily x 2 days, 5 tabs daily x 2 days, 4 tabs daily x 2 days, etc 42 tablet Particia Nearing, PA-C   triamcinolone cream (KENALOG) 0.1 % Apply 1 application topically 2  (two) times daily as needed. Avoid use on the face 90 g Particia Nearing, [place]     PDMP not reviewed this encounter.   Particia Nearing, [place] 09/14/20 1133

## 2020-11-28 ENCOUNTER — Other Ambulatory Visit: Payer: Self-pay

## 2020-11-28 ENCOUNTER — Ambulatory Visit (INDEPENDENT_AMBULATORY_CARE_PROVIDER_SITE_OTHER)
Admission: EM | Admit: 2020-11-28 | Discharge: 2020-11-28 | Disposition: A | Discharge status: Home / Self Care | Payer: BC Managed Care – PPO | Source: Home / Self Care

## 2020-11-28 ENCOUNTER — Emergency Department (HOSPITAL_COMMUNITY): Payer: BC Managed Care – PPO

## 2020-11-28 ENCOUNTER — Encounter (HOSPITAL_COMMUNITY): Payer: Self-pay | Admitting: Pharmacy Technician

## 2020-11-28 ENCOUNTER — Encounter (HOSPITAL_COMMUNITY): Payer: Self-pay

## 2020-11-28 ENCOUNTER — Emergency Department (HOSPITAL_COMMUNITY)
Admission: EM | Admit: 2020-11-28 | Discharge: 2020-11-28 | Disposition: A | Discharge status: Home / Self Care | Payer: BC Managed Care – PPO | Attending: Emergency Medicine | Admitting: Emergency Medicine

## 2020-11-28 DIAGNOSIS — R1032 Left lower quadrant pain: Secondary | ICD-10-CM | POA: Insufficient documentation

## 2020-11-28 DIAGNOSIS — N200 Calculus of kidney: Secondary | ICD-10-CM | POA: Insufficient documentation

## 2020-11-28 DIAGNOSIS — D72819 Decreased white blood cell count, unspecified: Secondary | ICD-10-CM

## 2020-11-28 DIAGNOSIS — F17200 Nicotine dependence, unspecified, uncomplicated: Secondary | ICD-10-CM | POA: Insufficient documentation

## 2020-11-28 DIAGNOSIS — R9341 Abnormal radiologic findings on diagnostic imaging of renal pelvis, ureter, or bladder: Secondary | ICD-10-CM | POA: Insufficient documentation

## 2020-11-28 DIAGNOSIS — R9389 Abnormal findings on diagnostic imaging of other specified body structures: Secondary | ICD-10-CM

## 2020-11-28 DIAGNOSIS — R319 Hematuria, unspecified: Secondary | ICD-10-CM

## 2020-11-28 DIAGNOSIS — R102 Pelvic and perineal pain unspecified side: Secondary | ICD-10-CM

## 2020-11-28 LAB — COMPREHENSIVE METABOLIC PANEL
ALT: 26 U/L (ref 0–44)
AST: 26 U/L (ref 15–41)
Albumin: 3.7 g/dL (ref 3.5–5.0)
Alkaline Phosphatase: 90 U/L (ref 38–126)
Anion gap: 11 (ref 5–15)
BUN: 12 mg/dL (ref 6–20)
CO2: 23 mmol/L (ref 22–32)
Calcium: 9 mg/dL (ref 8.9–10.3)
Chloride: 103 mmol/L (ref 98–111)
Creatinine, Ser: 0.91 mg/dL (ref 0.44–1.00)
GFR, Estimated: >60 mL/min (ref >60)
Glucose, Bld: 88 mg/dL (ref 70–99)
Potassium: 4 mmol/L (ref 3.5–5.1)
Sodium: 137 mmol/L (ref 135–145)
Total Bilirubin: 0.9 mg/dL (ref 0.3–1.2)
Total Protein: 7.3 g/dL (ref 6.5–8.1)

## 2020-11-28 LAB — POCT URINALYSIS DIPSTICK, ED / UC
Bilirubin Urine: NEGATIVE
Glucose, UA: NEGATIVE mg/dL
Ketones, ur: NEGATIVE mg/dL
Leukocytes,Ua: NEGATIVE
Nitrite: NEGATIVE
Protein, ur: NEGATIVE mg/dL
Specific Gravity, Urine: 1.025 (ref 1.005–1.030)
Urobilinogen, UA: 0.2 mg/dL (ref 0.0–1.0)
pH: 6 (ref 5.0–8.0)

## 2020-11-28 LAB — I-STAT BETA HCG BLOOD, ED (MC, WL, AP ONLY): I-stat hCG, quantitative: <5 m[IU]/mL (ref <5)

## 2020-11-28 LAB — CBC WITH DIFFERENTIAL/PLATELET
Abs Immature Granulocytes: 0 10*3/uL (ref 0.00–0.07)
Basophils Absolute: 0 10*3/uL (ref 0.0–0.1)
Basophils Relative: 1 %
Eosinophils Absolute: 0.3 10*3/uL (ref 0.0–0.5)
Eosinophils Relative: 10 %
HCT: 41.5 % (ref 36.0–46.0)
Hemoglobin: 13 g/dL (ref 12.0–15.0)
Immature Granulocytes: 0 %
Lymphocytes Relative: 40 %
Lymphs Abs: 1.2 10*3/uL (ref 0.7–4.0)
MCH: 29.8 pg (ref 26.0–34.0)
MCHC: 31.3 g/dL (ref 30.0–36.0)
MCV: 95.2 fL (ref 80.0–100.0)
Monocytes Absolute: 0.2 10*3/uL (ref 0.1–1.0)
Monocytes Relative: 8 %
Neutro Abs: 1.2 10*3/uL — ABNORMAL LOW (ref 1.7–7.7)
Neutrophils Relative %: 41 %
Platelets: 215 10*3/uL (ref 150–400)
RBC: 4.36 MIL/uL (ref 3.87–5.11)
RDW: 13.2 % (ref 11.5–15.5)
WBC: 3 10*3/uL — ABNORMAL LOW (ref 4.0–10.5)
nRBC: 0 % (ref 0.0–0.2)

## 2020-11-28 LAB — LIPASE, BLOOD: Lipase: 33 U/L (ref 11–51)

## 2020-11-28 LAB — POC URINE PREG, ED: Preg Test, Ur: NEGATIVE

## 2020-11-28 MED ORDER — NAPROXEN 500 MG PO TABS: 500.0000 mg | ORAL_TABLET | Freq: Two times a day (BID) | ORAL | 0 refills | Status: DC | PRN

## 2020-11-28 MED ORDER — IOHEXOL 300 MG/ML  SOLN
100.0000 mL | Freq: Once | INTRAMUSCULAR | Status: AC | PRN
  Administered 2020-11-28: 100 mL via INTRAVENOUS

## 2020-11-28 MED ORDER — ONDANSETRON HCL 4 MG/2ML IJ SOLN
4.0000 mg | Freq: Once | INTRAMUSCULAR | Status: AC
  Administered 2020-11-28: 4 mg via INTRAVENOUS
  Filled 2020-11-28: qty 2

## 2020-11-28 MED ORDER — MORPHINE SULFATE (PF) 4 MG/ML IV SOLN
4.0000 mg | Freq: Once | INTRAVENOUS | Status: AC
  Administered 2020-11-28: 4 mg via INTRAVENOUS
  Filled 2020-11-28: qty 1

## 2020-11-28 MED ORDER — SODIUM CHLORIDE 0.9 % IV BOLUS
1000.0000 mL | Freq: Once | INTRAVENOUS | Status: AC
  Administered 2020-11-28: 1000 mL via INTRAVENOUS

## 2020-11-28 MED ORDER — METHOCARBAMOL 500 MG PO TABS: 500.0000 mg | ORAL_TABLET | Freq: Three times a day (TID) | ORAL | 0 refills | Status: DC | PRN

## 2020-11-28 NOTE — ED Provider Notes (Signed)
[facility]    CSN: [id number] Arrival date & time: 11/28/20  0827      History   Chief Complaint Chief Complaint  Patient presents with   Abdominal Pain    HPI Pamela Lewis is a 45 y.o. female.   HPI  Abdominal Pain: Patient states that for the past few days she has had a left lower quadrant abdominal pain.  Symptoms worsened as of last night.  She states that the pain feels sharp and deep in nature.  Feels similar to a previous ectopic pregnancy that she had many years ago.  She reports that she still has the fallopian tube and ovary on this side despite this history.  She reports that last night in bed she turned over and felt a pop like sensation in the area of pain and that symptoms have improved some but are still rated about a 6 out of 10 in nature very sharp.  She has had accompanying nausea and some diarrhea.  No dysuria, urinary frequency, fever, vaginal discharge. Advil and tylenol do not help with pain.  She reports that she has a high pain tolerance and that her blood pressure elevation today is likely secondary to her pain.  Past Medical History:  Diagnosis Date   Anxiety    Migraine     Patient Active Problem List   Diagnosis Date Noted   Breast abscess of female 04/06/2013    Past Surgical History:  Procedure Laterality Date   BREAST REDUCTION SURGERY     IRRIGATION AND DEBRIDEMENT ABSCESS Right 04/06/2013   Procedure: IRRIGATION AND Drainage RIGHT BREAST ABSCESS;  Surgeon: Wilmon Arms. Corliss Skains, MD;  Location: [facility];  Service: General;  Laterality: Right;    OB History   No obstetric history on file.      Home Medications    Prior to Admission medications   Medication Sig Start Date End Date Taking? Authorizing Provider  albuterol (VENTOLIN HFA) 108 (90 Base) MCG/ACT inhaler Inhale 1-2 puffs into the lungs every 6 (six) hours as needed for wheezing or shortness of breath. 02/12/20   Bast, Gloris Manchester A, NP  benzonatate (TESSALON) 100 MG capsule  Take 1-2 capsules (100-200 mg total) by mouth 3 (three) times daily as needed. 02/12/20   Dahlia Byes A, NP  cetirizine (ZYRTEC ALLERGY) 10 MG tablet Take 1 tablet (10 mg total) by mouth daily. 02/12/20   Dahlia Byes A, NP  doxycycline (VIBRAMYCIN) 100 MG capsule Take 1 capsule (100 mg total) by mouth 2 (two) times daily. 03/05/20   Rodriguez-Southworth, Nettie Elm, PA-C  ELDERBERRY PO Take by mouth.    [provider]  fluconazole (DIFLUCAN) 150 MG tablet Take one now, and one when done with the antibiotic 03/05/20   Rodriguez-Southworth, Nettie Elm, PA-C  fluticasone (FLONASE) 50 MCG/ACT nasal spray Place 2 sprays into both nostrils daily. 03/05/20   Rodriguez-Southworth, Nettie Elm, PA-C  Ginger, Zingiber officinalis, (GINGER PO) Take 1 tablet by mouth daily.    [provider]  Multiple Vitamin (MULTIVITAMIN WITH MINERALS) TABS tablet Take 1 tablet by mouth daily.    [provider]  predniSONE (DELTASONE) 10 MG tablet Take 6 tabs daily x 2 days, 5 tabs daily x 2 days, 4 tabs daily x 2 days, etc 09/14/20   Particia Nearing, PA-C  triamcinolone cream (KENALOG) 0.1 % Apply 1 application topically 2 (two) times daily as needed. Avoid use on the face 09/14/20   Particia Nearing, PA-C  TURMERIC PO Take 1 tablet by  mouth daily.    [provider]    Family History Family History  Problem Relation Age of Onset   Diabetes Mother    Hypertension Mother     Social History Social History   Tobacco Use   Smoking status: Every Day   Smokeless tobacco: Never  Substance Use Topics   Alcohol use: Yes   Drug use: No     Allergies   Other   Review of Systems Review of Systems  As stated above in HPI Physical Exam Triage Vital Signs ED Triage Vitals  Enc Vitals Group     BP 11/28/20 0904 (!) 151/102     Pulse Rate 11/28/20 0904 76     Resp 11/28/20 0904 18     Temp 11/28/20 0904 98.6 F (37 C)     Temp Source 11/28/20 0904 Oral     SpO2 11/28/20  0904 98 %     Weight --      Height --      Head Circumference --      Peak Flow --      Pain Score 11/28/20 0903 7     Pain Loc --      Pain Edu? --      Excl. in GC? --    No data found.  Updated Vital Signs BP (!) 151/102 (BP Location: Left Arm)   Pulse 76   Temp 98.6 F (37 C) (Oral)   Resp 18   SpO2 98%   Physical Exam Vitals and nursing note reviewed.  Constitutional:      General: She is not in acute distress.    Appearance: She is well-developed. She is not ill-appearing, toxic-appearing or diaphoretic.  Cardiovascular:     Rate and Rhythm: Normal rate and regular rhythm.     Heart sounds: Normal heart sounds.  Pulmonary:     Effort: Pulmonary effort is normal.     Breath sounds: Normal breath sounds.  Abdominal:     General: Abdomen is flat. Bowel sounds are normal. There is no distension.     Palpations: Abdomen is soft. There is no shifting dullness, fluid wave, hepatomegaly, splenomegaly, mass or pulsatile mass.     Tenderness: There is abdominal tenderness in the left lower quadrant. There is guarding. There is no right CVA tenderness, left CVA tenderness or rebound. Negative signs include Murphy's sign and McBurney's sign.     Hernia: No hernia is present.  Skin:    General: Skin is warm.     Coloration: Skin is not jaundiced or pale.  Neurological:     Mental Status: She is alert.     UC Treatments / Results  Labs (all labs ordered are listed, but only abnormal results are displayed) Labs Reviewed  POCT URINALYSIS DIPSTICK, ED / UC - Abnormal; Notable for the following components:      Result Value   Hgb urine dipstick MODERATE (*)    All other components within normal limits  URINE CULTURE  POC URINE PREG, ED    EKG   Radiology No results found.  Procedures Procedures (including critical care time)  Medications Ordered in UC Medications - No data to display  Initial Impression / Assessment and Plan / UC Course  I have reviewed the  triage vital signs and the nursing notes.  Pertinent labs & imaging results that were available during my care of the patient were reviewed by me and considered in my medical decision making (see chart for  details).     New.  Urinalysis and hCG pending. Urine shows moderate hemoglobin- could be kidney stone but the "pop" history makes me concerned for ovarian etiology. I discussed my concerns with patient and the recommendation for advanced imaging. AT this time we elected to have her proceed to the ER for further work up and advanced imaging to prevent systemic complications. She will be NPO.    Final Clinical Impressions(s) / UC Diagnoses   Final diagnoses:  None   Discharge Instructions   None    ED Prescriptions   None    PDMP not reviewed this encounter.   Rushie Chestnut, [place] 11/28/20 [phone]

## 2020-11-28 NOTE — ED Provider Notes (Signed)
[facility] Provider Note   CSN: [id number] Arrival date & time: 11/28/20  1003     History Chief Complaint  Patient presents with   Abdominal Pain    Pamela Lewis is a 45 y.o. female with a hx of anxiety & migraines who presents tot he ED with complaints of abdominal pain x 2 days. Patient describes the pain as a knot type sensation in the LLQ with radiation to her back, it is constant, worse with a popping type sensation this morning while stretching. Went to UC due to increased pain, noted hematuria, was sent to the ED. Current pain is a 5/10 in severity. Having associated nausea and bloating but no vomiting. Started taking a probiotic with some increased frequency in Bms, but no significant diarrhea, constipation or melena. Denies fever, chills, vomiting, dysuria, or vaginal bleeding/discharge. Sexually active in a monogamous relationship without concern for STI. Hx of ectopic pregnancy and this feels somewhat similar but not the same, no prior abdominal surgeries.   HPI     Past Medical History:  Diagnosis Date   Anxiety    Migraine     Patient Active Problem List   Diagnosis Date Noted   Breast abscess of female 04/06/2013    Past Surgical History:  Procedure Laterality Date   BREAST REDUCTION SURGERY     IRRIGATION AND DEBRIDEMENT ABSCESS Right 04/06/2013   Procedure: IRRIGATION AND Drainage RIGHT BREAST ABSCESS;  Surgeon: Wilmon ArmsMatthew K. Corliss Skainssuei, MD;  Location: [facility] OR;  Service: General;  Laterality: Right;     OB History   No obstetric history on file.     Family History  Problem Relation Age of Onset   Diabetes Mother    Hypertension Mother     Social History   Tobacco Use   Smoking status: Every Day   Smokeless tobacco: Never  Substance Use Topics   Alcohol use: Yes   Drug use: No    Home Medications Prior to Admission medications   Medication Sig Start Date End Date Taking? Authorizing Provider  albuterol  (VENTOLIN HFA) 108 (90 Base) MCG/ACT inhaler Inhale 1-2 puffs into the lungs every 6 (six) hours as needed for wheezing or shortness of breath. 02/12/20   Bast, Gloris Manchesterraci A, NP  benzonatate (TESSALON) 100 MG capsule Take 1-2 capsules (100-200 mg total) by mouth 3 (three) times daily as needed. 02/12/20   Dahlia ByesBast, Traci A, NP  cetirizine (ZYRTEC ALLERGY) 10 MG tablet Take 1 tablet (10 mg total) by mouth daily. 02/12/20   Bast, Gloris Manchesterraci A, NP  ELDERBERRY PO Take by mouth.    [provider]  fluticasone (FLONASE) 50 MCG/ACT nasal spray Place 2 sprays into both nostrils daily. 03/05/20   Rodriguez-Southworth, Nettie ElmSylvia, PA-C  Ginger, Zingiber officinalis, (GINGER PO) Take 1 tablet by mouth daily.    [provider]  Multiple Vitamin (MULTIVITAMIN WITH MINERALS) TABS tablet Take 1 tablet by mouth daily.    [provider]  triamcinolone cream (KENALOG) 0.1 % Apply 1 application topically 2 (two) times daily as needed. Avoid use on the face 09/14/20   Particia NearingLane, Rachel Elizabeth, PA-C  TURMERIC PO Take 1 tablet by mouth daily.    [provider]    Allergies    Other  Review of Systems   Review of Systems  Constitutional:  Negative for chills and fever.  Respiratory:  Negative for shortness of breath.   Cardiovascular:  Negative for chest pain.  Gastrointestinal:  Positive for abdominal pain  and nausea. Negative for blood in stool, constipation, diarrhea and vomiting.  Genitourinary:  Negative for dysuria and urgency.  Neurological:  Negative for syncope and weakness.  All other systems reviewed and are negative.  Physical Exam Updated Vital Signs BP 131/88 (BP Location: Right Arm)   Pulse 77   Temp 98.3 F (36.8 C)   Resp 18   SpO2 99%   Physical Exam Vitals and nursing note reviewed.  Constitutional:      General: She is not in acute distress.    Appearance: She is well-developed. She is not toxic-appearing.  HENT:     Head: Normocephalic and atraumatic.  Eyes:      General:        Right eye: No discharge.        Left eye: No discharge.     Conjunctiva/sclera: Conjunctivae normal.  Cardiovascular:     Rate and Rhythm: Normal rate and regular rhythm.  Pulmonary:     Effort: Pulmonary effort is normal. No respiratory distress.     Breath sounds: Normal breath sounds. No wheezing, rhonchi or rales.  Abdominal:     General: There is no distension.     Palpations: Abdomen is soft.     Tenderness: There is abdominal tenderness in the suprapubic area and left lower quadrant. There is no right CVA tenderness, left CVA tenderness, guarding or rebound.  Musculoskeletal:     Cervical back: Neck supple.  Skin:    General: Skin is warm and dry.     Findings: No rash.  Neurological:     Mental Status: She is alert.     Comments: Clear speech.   Psychiatric:        Behavior: Behavior normal.    ED Results / Procedures / Treatments   Labs (all labs ordered are listed, but only abnormal results are displayed) Labs Reviewed  CBC WITH DIFFERENTIAL/PLATELET - Abnormal; Notable for the following components:      Result Value   WBC 3.0 (*)    Neutro Abs 1.2 (*)    All other components within normal limits  COMPREHENSIVE METABOLIC PANEL  LIPASE, BLOOD  URINALYSIS, ROUTINE W REFLEX MICROSCOPIC  I-STAT BETA HCG BLOOD, ED ([facility], [facility], [facility] ONLY)    EKG None  Radiology CT Abdomen Pelvis W Contrast  Result Date: 11/28/2020 CLINICAL DATA:  Left lower quadrant abdominal pain EXAM: CT ABDOMEN AND PELVIS WITH CONTRAST TECHNIQUE: Multidetector CT imaging of the abdomen and pelvis was performed using the standard protocol following bolus administration of intravenous contrast. CONTRAST:  OMNIPAQUE IOHEXOL 300 MG/ML  SOLN COMPARISON:  CT Abdomen Pelvis, 06/17/2017. FINDINGS: Lower chest: No acute abnormality. Hepatobiliary: No focal liver abnormality is seen. No gallstones, gallbladder wall thickening, or biliary dilatation. Pancreas: Unremarkable. No pancreatic  ductal dilatation or surrounding inflammatory changes. Spleen: Normal in size without focal abnormality. Adrenals/Urinary Tract: Adrenal glands are unremarkable. Right renal scar. 8 mm nonobstructing nephrolith at the right upper pole collecting system. No hydronephrosis. Bladder is unremarkable. Stomach/Bowel: Stomach is within normal limits. Appendix is air distended and appears normal (image 90/182). No evidence of bowel wall thickening, distention, or inflammatory changes. Vascular/Lymphatic: No significant vascular findings are present. No enlarged abdominal or pelvic lymph nodes. Reproductive: Essure coils.  Cervical/nabothian cysts. Other: Small fat-containing umbilical hernia. No abdominopelvic ascites. Musculoskeletal: Multilevel degenerative change of spine. No acute osseous findings. IMPRESSION: 1. No acute abdominopelvic process. 2. 8 mm right nonobstructing renal calculus.  No hydronephrosis. Electronically Signed   By: Cletis Athens  Mugweru MD   On: 11/28/2020 16:56   US PELVIC COMPLETE W TRANSVAGINAL AND TORSION R/O  Result Date: 11/28/2020 CLINICAL DATA:  Pelvic pain EXAM: TRANSABDOMINAL AND TRANSVAGINAL ULTRASOUND OF PELVIS DOPPLER ULTRASOUND OF OVARIES TECHNIQUE: Both transabdominal and transvaginal ultrasound examinations of the pelvis were performed. Transabdominal technique was performed for global imaging of the pelvis including uterus, ovaries, adnexal regions, and pelvic cul-de-sac. It was necessary to proceed with endovaginal exam following the transabdominal exam to visualize the ovaries. Color and duplex Doppler ultrasound was utilized to evaluate blood flow to the ovaries. COMPARISON:  CT 06/17/2017 FINDINGS: Uterus Measurements: 9.0 x 4.3 x 5.8 cm = volume: 117 mL. No fibroids or other mass visualized. Multiple nabothian cysts at the level of the cervix, some of which appear mildly complex. Largest measures approximately 2.0 cm in diameter. Small polypoid structure at the level of the cervix  measuring 1.2 x 0.9 x 1.1 cm (image 72). No internal vascularity was seen within this structure. Endometrium Thickness: 9 mm.  No focal abnormality visualized. Right ovary Measurements: 2.8 x 2.4 x 2.1 cm = volume: 7 mL. Normal appearance/no adnexal mass. Left ovary Measurements: 2.6 x 1.5 x 1.8 cm = volume: 4 mL. Normal appearance/no adnexal mass. Pulsed Doppler evaluation of both ovaries demonstrates normal low-resistance arterial and venous waveforms. Other findings Trace free fluid within the cul-de-sac. IMPRESSION: 1. No evidence of adnexal torsion. 2. Small polypoid structure at the level of the cervix measuring up to 1.2 cm which may reflect a cervical polyp. Gynecologic exam and tissue sampling recommended to exclude neoplasm. 3. Multiple nabothian cysts measuring up to 2.0 cm. 4. Uterus and endometrium within normal limits. Electronically Signed   By: Duanne Guess D.O.   On: 11/28/2020 12:23    Procedures Procedures   Medications Ordered in ED Medications - No data to display  ED Course  I have reviewed the triage vital signs and the nursing notes.  Pertinent labs & imaging results that were available during my care of the patient were reviewed by me and considered in my medical decision making (see chart for details).    MDM Rules/Calculators/A&P                           Patient presents to the ED with complaints of abdominal pain. Patient nontoxic appearing, in no apparent distress, vitals with initial elevated BP. LLQ and suprapubic tenderness on exam, no peritoneal signs. Analgesics, anti-emetics, and fluids administered.   Additional history obtained:  Additional history obtained from chart review & nursing note review.   Lab Tests:  I reviewed and interpreted labs, which included:  CBC: Mild leukopenia CMP: Unremarkable.  Lipase: WNL Preg test: Negative.  Urinalysis done at [facility] showed hematuria, no UTI.    Imaging Studies ordered:  Pelvic US ordered by provider in  triage-1. No evidence of adnexal torsion. 2. Small polypoid structure at the level of the cervix measuring up to 1.2 cm which may reflect a cervical polyp. Gynecologic exam and tissue sampling recommended to exclude neoplasm. 3. Multiple nabothian cysts measuring up to 2.0 cm. 4. Uterus and endometrium within normal limits.   ED Course:  Given pelvic US performed prior to assessment we discussed risks/benefits of pelvic exam, patient is not concerned for STI and is monogamous & declines this, will proceed with CT A/P for further evaluation.   CT A/P: 1. No acute abdominopelvic process. 2. 8 mm right nonobstructing renal calculus.  No  hydronephrosis  18:00: RE-EVAL: Patient feeling much better.   Overall reassuring work-up in the emergency department.  Pelvic ultrasound does not show findings of an ovarian cyst or torsion, patient's polypoid structure at the level of the cervix was discussed as well as the need for further gynecologic examination and tissue sampling to exclude neoplasm-she will be given our OB/GYN information for follow-up, she states that she has a follow-up with her [facility] primary care provider who can also refer her to an OB/GYN.  Hematuria noted on urinalysis, this has been present previously, will have her follow-up with urology for this.  Pregnancy test is negative therefore doubt ectopic pregnancy.  She is in monogamous relationship without complaints of vaginal discharge or dysuria, feel that PID is less likely.  Her CT scan does not show findings of diverticulitis, colitis, perforation, obstruction, or other acute surgical process.  There is no left inguinal hernia on CT.  There are no ureteral stones on CT.  Urinalysis not infection therefore do not suspect pyelonephritis.  Patient has no overlying rash to suggest shingles.  Overall unclear definitive etiology to patient's pain, repeat abdominal exam remains without peritoneal signs, given pain more so with certain  movements and popping sensation will trial treatment for musculoskeletal type pain, however discussed need for PCP follow-up and strict return precautions.    I discussed results, treatment plan, need for follow-up, and return precautions with the patient & her husband. Provided opportunity for questions, patient & her husband confirmed understanding and are in agreement with plan.   Findings and plan of care discussed with supervising physician Dr. Deretha Emory who is in agreement.   Portions of this note were generated with Scientist, clinical (histocompatibility and immunogenetics). Dictation errors may occur despite best attempts at proofreading.    Final Clinical Impression(s) / ED Diagnoses Final diagnoses:  Left lower quadrant abdominal pain  Hematuria, unspecified type  Leukopenia, unspecified type  Abnormal pelvic ultrasound    Rx / DC Orders ED Discharge Orders          Ordered    naproxen (NAPROSYN) 500 MG tablet  2 times daily PRN        11/28/20 1816    methocarbamol (ROBAXIN) 500 MG tablet  Every 8 hours PRN        11/28/20 1816             Cherly Anderson, PA-C 11/28/20 1823    Gerhard Munch, MD 11/29/20 1614

## 2020-11-28 NOTE — ED Triage Notes (Signed)
Pt presents with LLQ abdominal pain since last week.

## 2020-11-28 NOTE — ED Provider Notes (Signed)
Emergency Medicine Provider Triage Evaluation Note  Pamela Lewis , a 45 y.o. female  was evaluated in triage.  Pt complains of left lower quadrant abdominal pain x1 week.  It is sharp, feels like previous ectopic pregnancy.  Last period was 1 month ago, she is not having any nausea or vomiting.  No previous abdominal surgeries..  Review of Systems  Positive: Left lower quadrant abdominal pain Negative: Nausea, vomiting, dysuria  Physical Exam  There were no vitals taken for this visit. Gen:   Awake, no distress   Resp:  Normal effort  MSK:   Moves extremities without difficulty  Other:  Left lower quadrant left pelvic tenderness with some guarding.  No rigidity.  Medical Decision Making  Medically screening exam initiated at 10:13 AM.  Appropriate orders placed.  Shterna Fosco was informed that the remainder of the evaluation will be completed by another provider, this initial triage assessment does not replace that evaluation, and the importance of remaining in the ED until their evaluation is complete.     Theron Arista, PA-C 11/28/20 1014    Rolan Bucco, MD 11/29/20 [phone]

## 2020-11-28 NOTE — ED Triage Notes (Signed)
Pt with LLQ abdominal pain onset 1 week ago. Pt states pain progressively worsening. Pt denies dysuria. Endorses nausea, denies vomiting.

## 2020-11-28 NOTE — ED Notes (Signed)
DC instructions reviewed with pt. PT verbalized understanding. PT DC °

## 2020-11-28 NOTE — Discharge Instructions (Addendum)
You were seen in the emergency department today for abdominal pain your blood work was overall reassuring, your white blood cell count was mildly low, please have this rechecked by your primary care provider.  There was also blood noted in your urine in urgent care but no signs of infection, for this reason we would like you to follow-up with urology as you have had blood in your urine in the past.  Your pelvic ultrasound showed an abnormality of your cervix, this will need to be further evaluated by a biopsy by an OB/GYN doctor, please discuss this with your OB/GYN or we have given information for our women's health group.  Your CT scan did not show any signs of a colon infection.  There was a stone within your right kidney, however given it is in your kidney it is less likely to be causing pain, typically kidney stones cause pain when they are in the ureter or bladder area.  We are sending you home with the following medicines to try to help with discomfort:  - Naproxen is a nonsteroidal anti-inflammatory medication that will help with pain and swelling. Be sure to take this medication as prescribed with food, 1 pill every 12 hours,  It should be taken with food, as it can cause stomach upset, and more seriously, stomach bleeding. Do not take other nonsteroidal anti-inflammatory medications with this such as Advil, Motrin, Aleve, Mobic, Goodie Powder, or Motrin.    - Robaxin is the muscle relaxer I have prescribed, this is meant to help with muscle tightness. Be aware that this medication may make you drowsy therefore the first time you take this it should be at a time you are in an environment where you can rest. Do not drive or operate heavy machinery when taking this medication. Do not drink alcohol or take other sedating medications with this medicine such as narcotics or benzodiazepines.   You make take Tylenol per over the counter dosing with these medications.   We have prescribed you new  medication(s) today. Discuss the medications prescribed today with your pharmacist as they can have adverse effects and interactions with your other medicines including over the counter and prescribed medications. Seek medical evaluation if you start to experience new or abnormal symptoms after taking one of these medicines, seek care immediately if you start to experience difficulty breathing, feeling of your throat closing, facial swelling, or rash as these could be indications of a more serious allergic reaction   Please follow-up with your primary care provider within 3 days for reevaluation.  Return to the emergency department for any new or worsening symptoms including but not limited to new or worsening pain, fever, inability to keep fluids down, blood in vomit or stool, passing out, chest pain, trouble breathing, or any other concerns

## 2020-11-29 LAB — URINE CULTURE: Culture: NO GROWTH

## 2021-05-01 ENCOUNTER — Other Ambulatory Visit: Payer: Self-pay

## 2021-05-01 ENCOUNTER — Ambulatory Visit (INDEPENDENT_AMBULATORY_CARE_PROVIDER_SITE_OTHER): Payer: BC Managed Care – PPO

## 2021-05-01 ENCOUNTER — Encounter (HOSPITAL_COMMUNITY): Payer: Self-pay

## 2021-05-01 ENCOUNTER — Ambulatory Visit (HOSPITAL_COMMUNITY)
Admission: EM | Admit: 2021-05-01 | Discharge: 2021-05-01 | Disposition: A | Discharge status: Home / Self Care | Payer: BC Managed Care – PPO | Attending: Family Medicine | Admitting: Family Medicine

## 2021-05-01 DIAGNOSIS — M545 Low back pain, unspecified: Secondary | ICD-10-CM | POA: Diagnosis not present

## 2021-05-01 DIAGNOSIS — W19XXXA Unspecified fall, initial encounter: Secondary | ICD-10-CM

## 2021-05-01 DIAGNOSIS — M549 Dorsalgia, unspecified: Secondary | ICD-10-CM

## 2021-05-01 DIAGNOSIS — M546 Pain in thoracic spine: Secondary | ICD-10-CM | POA: Diagnosis not present

## 2021-05-01 NOTE — Discharge Instructions (Addendum)
Your xrays did not show any fractures, though you do have signs of arthritis and degenerative changes on the xrays.  Continue tylenol or ibuprofen as needed for the pain.

## 2021-05-01 NOTE — ED Provider Notes (Signed)
[place]    CSN: [id number] Arrival date & time: 05/01/21  [id number]      History   Chief Complaint Chief Complaint  Patient presents with   Back Pain    HPI Pamela Lewis is a 46 y.o. female.    Back Pain Here for low and mid back pain since she fell backwards out of her shower on 04/23/21. Tylenol and ibuprofen have been helping some. 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 Georgette Dover, MD;  Location: [facility];  Service: General;  Laterality: Right;    OB History   No obstetric history on file.      Home Medications    Prior to Admission medications   Medication Sig Start Date End Date Taking? Authorizing Provider  albuterol (VENTOLIN HFA) 108 (90 Base) MCG/ACT inhaler Inhale 1-2 puffs into the lungs every 6 (six) hours as needed for wheezing or shortness of breath. 02/12/20   Loura Halt A, NP  cetirizine (ZYRTEC ALLERGY) 10 MG tablet Take 1 tablet (10 mg total) by mouth daily. 02/12/20   Bast, Tressia Miners A, NP  ELDERBERRY PO Take by mouth.    [provider]  fluticasone (FLONASE) 50 MCG/ACT nasal spray Place 2 sprays into both nostrils daily. 03/05/20   Rodriguez-Southworth, Sunday Spillers, PA-C  Ginger, Zingiber officinalis, (GINGER PO) Take 1 tablet by mouth daily.    [provider]  Multiple Vitamin (MULTIVITAMIN WITH MINERALS) TABS tablet Take 1 tablet by mouth daily.    [provider]  triamcinolone cream (KENALOG) 0.1 % Apply 1 application topically 2 (two) times daily as  needed. Avoid use on the face 09/14/20   Volney American, PA-C  TURMERIC PO Take 1 tablet by mouth daily.    [provider]    Family History Family History  Problem Relation Age of Onset   Diabetes Mother    Hypertension Mother     Social History Social History   Tobacco Use   Smoking status: Every Day   Smokeless tobacco: Never  Substance Use Topics   Alcohol use: Yes   Drug use: No     Allergies   Other   Review of Systems Review of Systems  Musculoskeletal:  Positive for back pain.    Physical Exam Triage Vital Signs ED Triage Vitals  Enc Vitals Group     BP 05/01/21 0931 (!) 143/83     Pulse Rate 05/01/21 0931 90     Resp 05/01/21 0931 18     Temp 05/01/21 0931 98.3 F (36.8 C)     Temp Source 05/01/21 0931 Oral     SpO2 05/01/21 0931 90 %     Weight --      Height --      Head Circumference --      Peak Flow --      Pain Score 05/01/21 0932 6     Pain Loc --      Pain Edu? --  Excl. in [place]? --    No data found.  Updated Vital Signs BP (!) 143/83 (BP Location: Right Arm)    Pulse 90    Temp 98.3 F (36.8 C) (Oral)    Resp 18    LMP 04/25/2021    SpO2 90%   Visual Acuity Right Eye Distance:   Left Eye Distance:   Bilateral Distance:    Right Eye Near:   Left Eye Near:    Bilateral Near:     Physical Exam Vitals reviewed.  Constitutional:      General: She is not in acute distress.    Appearance: She is not toxic-appearing.  HENT:     Mouth/Throat:     Mouth: Mucous membranes are moist.  Eyes:     Extraocular Movements: Extraocular movements intact.     Pupils: Pupils are equal, round, and reactive to light.  Cardiovascular:     Rate and Rhythm: Normal rate and regular rhythm.     Heart sounds: No murmur heard. Pulmonary:     Breath sounds: Normal breath sounds.  Musculoskeletal:     Comments: Some tenderness about T10 or T 11, and over sacrum. No erythema or ecchymosis seen at this time. No rash  Neurological:      General: No focal deficit present.     Mental Status: She is alert and oriented to person, place, and time.  Psychiatric:        Behavior: Behavior normal.     UC Treatments / Results  Labs (all labs ordered are listed, but only abnormal results are displayed) Labs Reviewed - No data to display  EKG   Radiology DG Thoracic Spine 2 View  Result Date: 05/01/2021 CLINICAL DATA:  Low back pain, recent fall 04/23/2021 EXAM: THORACIC SPINE 2 VIEWS COMPARISON:  09/11/2019 FINDINGS: Stable mild to moderate diffuse thoracic degenerative disc disease at all levels with disc space narrowing, sclerosis and endplate osteophytes. Preserved vertebral body heights. No acute compression fracture, wedge-shaped deformity or focal kyphosis. Normal paraspinous soft tissues. Normal appearing pedicles. Included chest unremarkable. IMPRESSION: Stable degenerative changes. No acute finding by plain radiography. Electronically Signed   By: Jerilynn Mages.  Shick M.D.   On: 05/01/2021 10:26   DG Lumbar Spine Complete  Result Date: 05/01/2021 CLINICAL DATA:  Low back pain, recent fall EXAM: LUMBAR SPINE - COMPLETE 4+ VIEW COMPARISON:  11/28/2020 FINDINGS: Stable alignment with lower thoracic and lower lumbar endplate degenerative changes and degenerative disc disease. Similar grade 1 anterolisthesis of L4 upon L5 measuring 6 mm appearing facet mediated. Degenerative disc disease most pronounced at L5-S1 with marked disc space narrowing, sclerosis and vacuum disc phenomenon. Lower lumbar facet arthropathy spanning L3-S1 with increase sclerosis posteriorly. No severe acute compression fracture, wedge-shaped deformity or focal kyphosis. Normal SI joints for age. Tubal occlusion devices noted in the pelvis. Nonobstructive bowel gas pattern. IMPRESSION: 1. Stable lumbar spine degenerative changes as above. No acute compression fracture by plain radiography. 2. Stable grade 1 anterolisthesis of L4 upon L5 by 6 mm likely facet mediated.  Electronically Signed   By: Jerilynn Mages.  Shick M.D.   On: 05/01/2021 10:28    Procedures Procedures (including critical care time)  Medications Ordered in UC Medications - No data to display  Initial Impression / Assessment and Plan / UC Course  I have reviewed the triage vital signs and the nursing notes.  Pertinent labs & imaging results that were available during my care of the patient were reviewed by me and considered in  my medical decision making (see chart for details).     Xrays negative for acute fracture. Does have degenerative changes Final Clinical Impressions(s) / UC Diagnoses   Final diagnoses:  Acute midline low back pain without sciatica  Mid back pain     Discharge Instructions      Your xrays did not show any fractures, though you do have signs of arthritis and degenerative changes on the xrays.  Continue tylenol or ibuprofen as needed for the pain.     ED Prescriptions   None    I have reviewed the PDMP during this encounter.   Barrett Henle, MD 05/01/21 [phone]

## 2021-05-01 NOTE — ED Triage Notes (Signed)
Pt states fell in the shower a week ago. C/o mid back pain radiating to tail bone. Taking tylenol and ibuprofen with relief.

## 2022-03-21 ENCOUNTER — Ambulatory Visit (HOSPITAL_COMMUNITY)
Admission: EM | Admit: 2022-03-21 | Discharge: 2022-03-21 | Disposition: A | Discharge status: Home / Self Care | Payer: BC Managed Care – PPO | Attending: Emergency Medicine | Admitting: Emergency Medicine

## 2022-03-21 ENCOUNTER — Encounter (HOSPITAL_COMMUNITY): Payer: Self-pay

## 2022-03-21 DIAGNOSIS — J069 Acute upper respiratory infection, unspecified: Secondary | ICD-10-CM | POA: Diagnosis not present

## 2022-03-21 MED ORDER — ALBUTEROL SULFATE HFA 108 (90 BASE) MCG/ACT IN AERS: 1.0000 | INHALATION_SPRAY | Freq: Four times a day (QID) | RESPIRATORY_TRACT | 0 refills | Status: DC | PRN

## 2022-03-21 MED ORDER — BENZONATATE 100 MG PO CAPS: 100.0000 mg | ORAL_CAPSULE | Freq: Three times a day (TID) | ORAL | 0 refills | Status: DC

## 2022-03-21 NOTE — ED Provider Notes (Signed)
[facility]    CSN: [id number] Arrival date & time: 03/21/22  1003      History   Chief Complaint Chief Complaint  Patient presents with   Cough   Nasal Congestion    HPI Pamela Lewis is a 46 y.o. female.  She reports 2 days of illness that includes productive cough with clear or white mucus, nasal congestion, postnasal drip, and sore throat.  She frequently gets ill like this and it turns into bronchitis, and she is worried that will happen this illness.  She has a bad taste in her mouth and believes her illness is turned into a bacterial infection.  She is taking a variety of vitamins and minerals such as zinc and vitamin D and turmeric to help ward off this illness.  She is also using her albuterol inhaler frequently.  She is not have any shortness of breath or wheezing at this time.   Cough   Past Medical History:  Diagnosis Date   Anxiety    Migraine     Patient Active Problem List   Diagnosis Date Noted   Breast abscess of female 04/06/2013    Past Surgical History:  Procedure Laterality Date   BREAST REDUCTION SURGERY     IRRIGATION AND DEBRIDEMENT ABSCESS Right 04/06/2013   Procedure: IRRIGATION AND Drainage RIGHT BREAST ABSCESS;  Surgeon: Wilmon Arms. Corliss Skains, MD;  Location: [facility];  Service: General;  Laterality: Right;    OB History   No obstetric history on file.      Home Medications    Prior to Admission medications   Medication Sig Start Date End Date Taking? Authorizing Provider  benzonatate (TESSALON) 100 MG capsule Take 1 capsule (100 mg total) by mouth every 8 (eight) hours. 03/21/22  Yes Cathlyn Parsons, NP  albuterol (VENTOLIN HFA) 108 (90 Base) MCG/ACT inhaler Inhale 1-2 puffs into the lungs every 6 (six) hours as needed for wheezing or shortness of breath. 03/21/22   Cathlyn Parsons, NP  cetirizine (ZYRTEC ALLERGY) 10 MG tablet Take 1 tablet (10 mg total) by mouth daily. 02/12/20   Bast, Gloris Manchester A, NP  ELDERBERRY PO Take by mouth.     [provider]  fluticasone (FLONASE) 50 MCG/ACT nasal spray Place 2 sprays into both nostrils daily. 03/05/20   Rodriguez-Southworth, Nettie Elm, PA-C  Ginger, Zingiber officinalis, (GINGER PO) Take 1 tablet by mouth daily.    [provider]  Multiple Vitamin (MULTIVITAMIN WITH MINERALS) TABS tablet Take 1 tablet by mouth daily.    [provider]  triamcinolone cream (KENALOG) 0.1 % Apply 1 application topically 2 (two) times daily as needed. Avoid use on the face 09/14/20   Particia Nearing, PA-C  TURMERIC PO Take 1 tablet by mouth daily.    [provider]    Family History Family History  Problem Relation Age of Onset   Diabetes Mother    Hypertension Mother     Social History Social History   Tobacco Use   Smoking status: Every Day   Smokeless tobacco: Never  Substance Use Topics   Alcohol use: Yes   Drug use: No     Allergies   Other   Review of Systems Review of Systems  Respiratory:  Positive for cough.      Physical Exam Triage Vital Signs ED Triage Vitals [03/21/22 1022]  Enc Vitals Group     BP 129/80     Pulse Rate 100     Resp 18  Temp 98.4 F (36.9 C)     Temp Source Oral     SpO2 100 %     Weight      Height      Head Circumference      Peak Flow      Pain Score      Pain Loc      Pain Edu?      Excl. in [place]?    No data found.  Updated Vital Signs BP 129/80 (BP Location: Left Arm)   Pulse 100   Temp 98.4 F (36.9 C) (Oral)   Resp 18   SpO2 100%   Visual Acuity Right Eye Distance:   Left Eye Distance:   Bilateral Distance:    Right Eye Near:   Left Eye Near:    Bilateral Near:     Physical Exam Constitutional:      General: She is not in acute distress.    Appearance: Normal appearance. She is ill-appearing. She is not toxic-appearing.  HENT:     Right Ear: Tympanic membrane, ear canal and external ear normal.     Left Ear: Tympanic membrane, ear canal and external ear normal.      Nose: Congestion present.     Mouth/Throat:     Mouth: Mucous membranes are moist.     Pharynx: Oropharynx is clear. No oropharyngeal exudate.  Pulmonary:     Effort: Pulmonary effort is normal.     Breath sounds: Normal breath sounds. No wheezing or rhonchi.  Lymphadenopathy:     Head:     Right side of head: No submandibular adenopathy.     Left side of head: No submandibular adenopathy.  Neurological:     Mental Status: She is alert.      UC Treatments / Results  Labs (all labs ordered are listed, but only abnormal results are displayed) Labs Reviewed - No data to display  EKG   Radiology No results found.  Procedures Procedures (including critical care time)  Medications Ordered in UC Medications - No data to display  Initial Impression / Assessment and Plan / UC Course  I have reviewed the triage vital signs and the nursing notes.  Pertinent labs & imaging results that were available during my care of the patient were reviewed by me and considered in my medical decision making (see chart for details).    Likely URI.  Offered testing for COVID here at urgent care or advised patient she could test yourself at home; she elects to test herself at home.  Discussed supportive care measures.  Final Clinical Impressions(s) / UC Diagnoses   Final diagnoses:  Viral upper respiratory tract infection     Discharge Instructions      Try using Mucinex (or generic guaifenesin) and drinking lots of liquids to help thin out your mucus.   Test yourself for COVID at home.   Try using saline irrigation, such as with a neti pot, several times a day while you are sick. Many neti pots come with salt packets premeasured to use to make saline. If you use your own salt, make sure it is kosher salt or sea salt (don't use table salt as it has iodine in it and you don't need that in your nose). Use distilled water to make saline. If you mix your own saline using your own salt, the  recipe is 1/4 teaspoon salt in 1 cup warm water. Using saline irrigation can help prevent and treat sinus infections.  ED Prescriptions     Medication Sig Dispense Auth. Provider   albuterol (VENTOLIN HFA) 108 (90 Base) MCG/ACT inhaler Inhale 1-2 puffs into the lungs every 6 (six) hours as needed for wheezing or shortness of breath. 1 each Carvel Getting, NP   benzonatate (TESSALON) 100 MG capsule Take 1 capsule (100 mg total) by mouth every 8 (eight) hours. 21 capsule Carvel Getting, NP      PDMP not reviewed this encounter.   Carvel Getting, NP 03/21/22 1050

## 2022-03-21 NOTE — Discharge Instructions (Addendum)
Try using Mucinex (or generic guaifenesin) and drinking lots of liquids to help thin out your mucus.   Test yourself for COVID at home.   Try using saline irrigation, such as with a neti pot, several times a day while you are sick. Many neti pots come with salt packets premeasured to use to make saline. If you use your own salt, make sure it is kosher salt or sea salt (don't use table salt as it has iodine in it and you don't need that in your nose). Use distilled water to make saline. If you mix your own saline using your own salt, the recipe is 1/4 teaspoon salt in 1 cup warm water. Using saline irrigation can help prevent and treat sinus infections.

## 2022-03-21 NOTE — ED Triage Notes (Signed)
Pt reports cough and chest congestion x 3 days.  Pt reports she has metallic taste in her mouth.

## 2022-04-24 IMAGING — DX DG THORACIC SPINE 2V
2 series · 2 of 2 positions shown · non-contrast
Comparison: 09/11/2019

CLINICAL DATA: Low back pain, recent fall 04/23/2021

EXAM:
THORACIC SPINE 2 VIEWS

[t-spine ap]
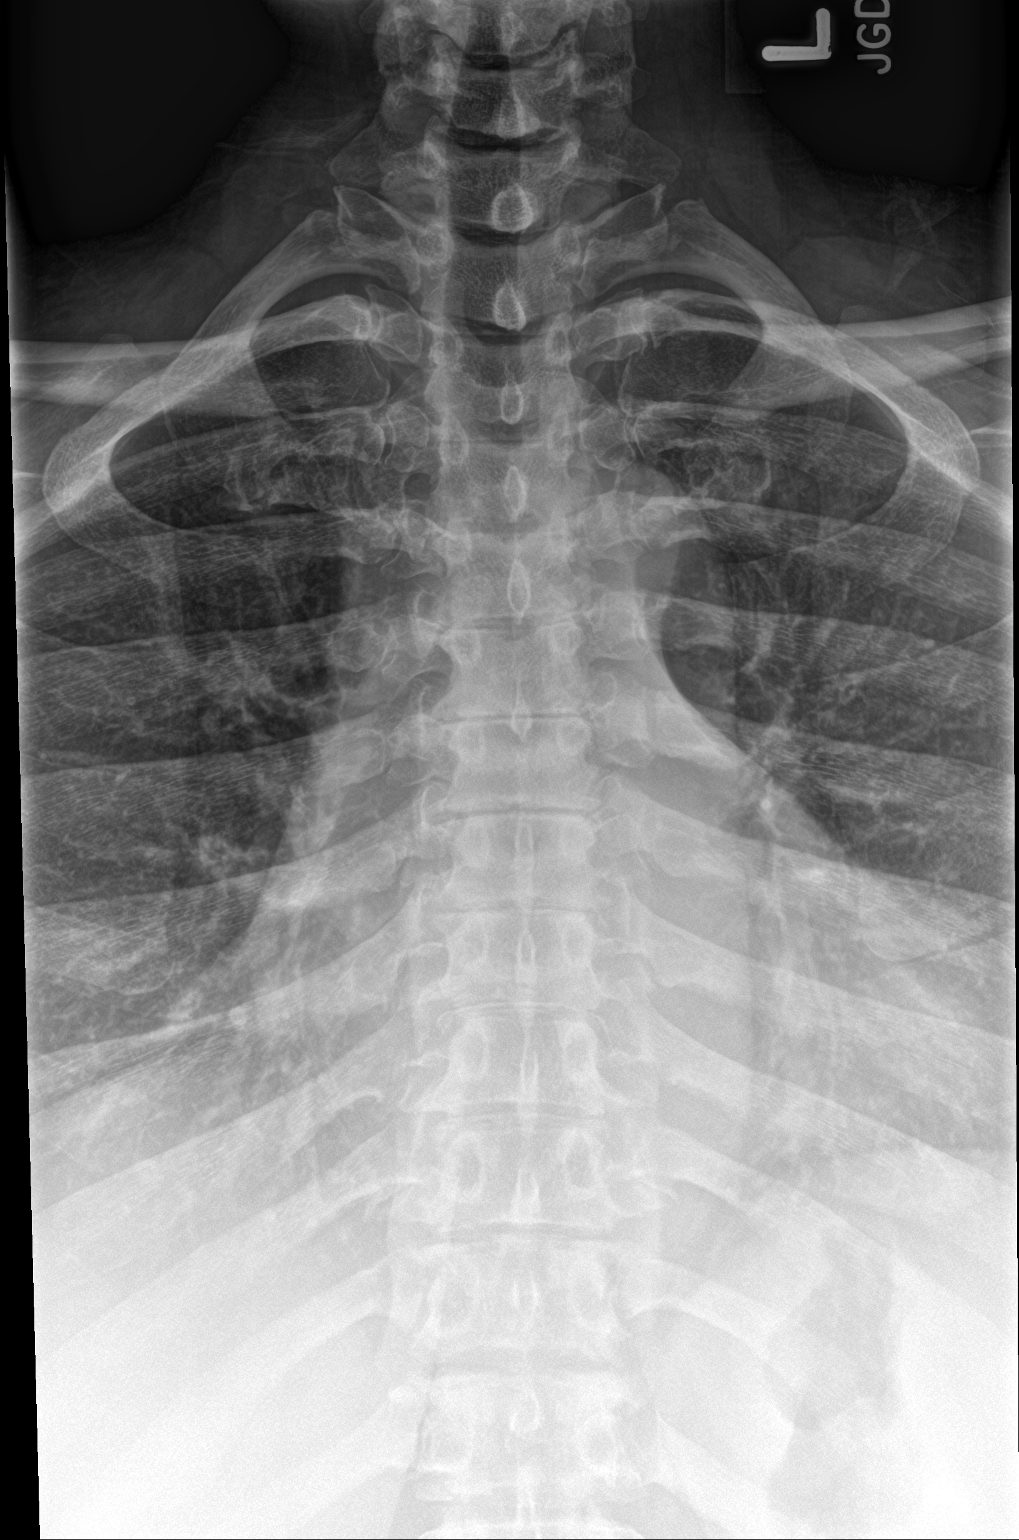

[t-spine lat]
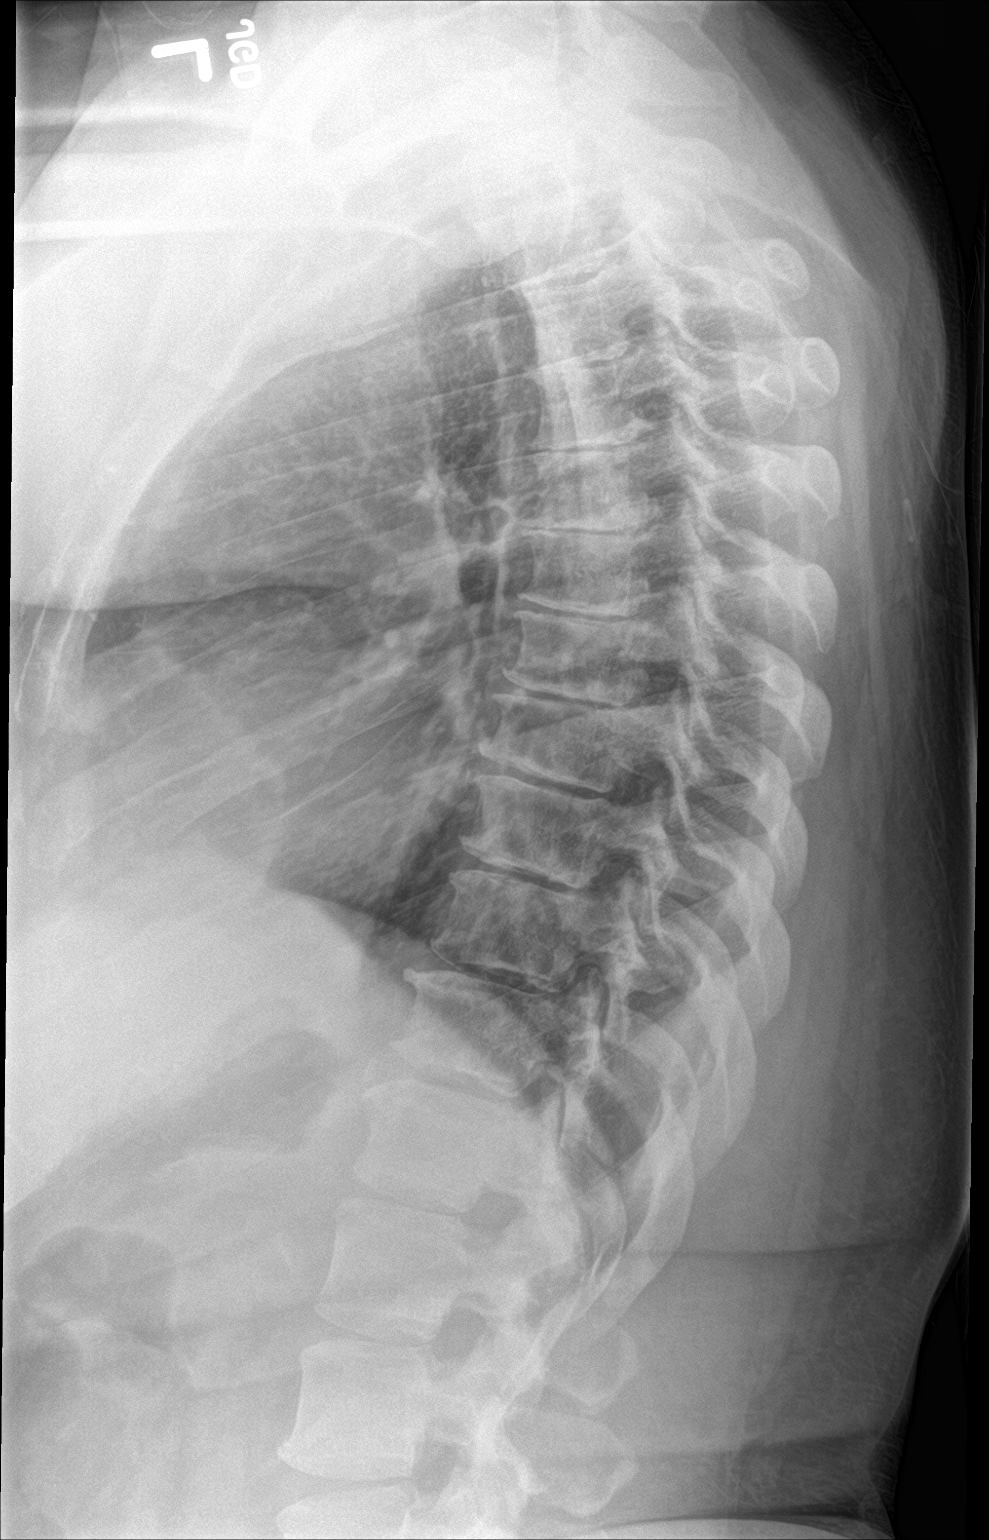

[2 of 2 positions shown; findings below may reference images not displayed]

FINDINGS: Stable mild to moderate diffuse thoracic degenerative disc disease
at all levels with disc space narrowing, sclerosis and endplate
osteophytes. Preserved vertebral body heights. No acute compression
fracture, wedge-shaped deformity or focal kyphosis. Normal
paraspinous soft tissues. Normal appearing pedicles. Included chest
unremarkable.
IMPRESSION: Stable degenerative changes. No acute finding by plain radiography.

## 2022-05-31 ENCOUNTER — Encounter (HOSPITAL_COMMUNITY): Payer: Self-pay | Admitting: Emergency Medicine

## 2022-05-31 ENCOUNTER — Ambulatory Visit (HOSPITAL_COMMUNITY)
Admission: EM | Admit: 2022-05-31 | Discharge: 2022-05-31 | Disposition: A | Discharge status: Home / Self Care | Payer: BC Managed Care – PPO | Attending: Emergency Medicine | Admitting: Emergency Medicine

## 2022-05-31 ENCOUNTER — Other Ambulatory Visit: Payer: Self-pay

## 2022-05-31 DIAGNOSIS — R051 Acute cough: Secondary | ICD-10-CM

## 2022-05-31 DIAGNOSIS — U071 COVID-19: Secondary | ICD-10-CM

## 2022-05-31 MED ORDER — ALBUTEROL SULFATE HFA 108 (90 BASE) MCG/ACT IN AERS: 1.0000 | INHALATION_SPRAY | Freq: Four times a day (QID) | RESPIRATORY_TRACT | 0 refills | Status: DC | PRN

## 2022-05-31 MED ORDER — BENZONATATE 100 MG PO CAPS: 100.0000 mg | ORAL_CAPSULE | Freq: Three times a day (TID) | ORAL | 0 refills | Status: DC

## 2022-05-31 MED ORDER — PAXLOVID (300/100) 20 X 150 MG & 10 X 100MG PO TBPK: 3.0000 | ORAL_TABLET | Freq: Two times a day (BID) | ORAL | 0 refills | Status: AC

## 2022-05-31 NOTE — Discharge Instructions (Addendum)
You were found to be positive for COVID-19.  We have started you on Paxlovid.  You can continue your BuSpar, as these 2 do not interact.  Please use Tessalon Perles as needed for cough.  You can use the albuterol inhaler as needed for wheezing or shortness of breath.  You can take Tylenol and ibuprofen as needed for body aches, fevers, chills.  For sore throat you can do tea with warm honey and sleep with a humidifier.  Please follow-up with your primary care provider if your symptoms do not improve.  Please seek immediate care if you develop chest pain, shortness of breath, or fatigue.

## 2022-05-31 NOTE — ED Provider Notes (Signed)
[facility]    CSN: [id number] Arrival date & time: 05/31/22  1649      History   Chief Complaint No chief complaint on file.   HPI Pamela Lewis is a 47 y.o. female.   Patient presents to clinic after testing positive for COVID-19 this morning.  Reports her husband tested positive on a home test last night.  Patient reports nonproductive cough, chills, sore throat and fever.  She denies shortness of breath, chest pain.  Came to clinic today seeking antiviral treatment.  The history is provided by the patient.    Past Medical History:  Diagnosis Date   Anxiety    Migraine     Patient Active Problem List   Diagnosis Date Noted   Breast abscess of female 04/06/2013    Past Surgical History:  Procedure Laterality Date   BREAST REDUCTION SURGERY     IRRIGATION AND DEBRIDEMENT ABSCESS Right 04/06/2013   Procedure: IRRIGATION AND Drainage RIGHT BREAST ABSCESS;  Surgeon: Imogene Burn. Georgette Dover, MD;  Location: [facility];  Service: General;  Laterality: Right;    OB History   No obstetric history on file.      Home Medications    Prior to Admission medications   Medication Sig Start Date End Date Taking? Authorizing Provider  albuterol (VENTOLIN HFA) 108 (90 Base) MCG/ACT inhaler Inhale 1-2 puffs into the lungs every 6 (six) hours as needed for wheezing or shortness of breath. 05/31/22  Yes Louretta Shorten, [place], FNP  benzonatate (TESSALON) 100 MG capsule Take 1 capsule (100 mg total) by mouth every 8 (eight) hours. 05/31/22  Yes Louretta Shorten, [place], FNP  nirmatrelvir & ritonavir (PAXLOVID, 300/100,) 20 x 150 MG & 10 x 100MG  TBPK Take 3 tablets by mouth 2 (two) times daily for 5 days. 05/31/22 06/05/22 Yes Louretta Shorten, [place], FNP  albuterol (VENTOLIN HFA) 108 (90 Base) MCG/ACT inhaler Inhale 1-2 puffs into the lungs every 6 (six) hours as needed for wheezing or shortness of breath. 03/21/22   Carvel Getting, NP  ELDERBERRY PO Take by mouth.    [provider]   fluticasone (FLONASE) 50 MCG/ACT nasal spray Place 2 sprays into both nostrils daily. 03/05/20   Rodriguez-Southworth, Sunday Spillers, PA-C  Ginger, Zingiber officinalis, (GINGER PO) Take 1 tablet by mouth daily.    [provider]  Multiple Vitamin (MULTIVITAMIN WITH MINERALS) TABS tablet Take 1 tablet by mouth daily.    [provider]  triamcinolone cream (KENALOG) 0.1 % Apply 1 application topically 2 (two) times daily as needed. Avoid use on the face 09/14/20   Volney American, PA-C  TURMERIC PO Take 1 tablet by mouth daily.    [provider]    Family History Family History  Problem Relation Age of Onset   Diabetes Mother    Hypertension Mother     Social History Social History   Tobacco Use   Smoking status: Every Day   Smokeless tobacco: Never  Vaping Use   Vaping Use: Never used  Substance Use Topics   Alcohol use: Yes   Drug use: No     Allergies   Other   Review of Systems Review of Systems  Constitutional:  Positive for chills, fatigue and fever.  HENT:  Positive for rhinorrhea and sore throat. Negative for ear pain.   Respiratory:  Positive for cough. Negative for chest tightness, shortness of breath and wheezing.   Cardiovascular:  Negative for chest pain.  Gastrointestinal:  Negative for abdominal pain.  Physical Exam Triage Vital Signs ED Triage Vitals  Enc Vitals Group     BP 05/31/22 1816 139/84     Pulse Rate 05/31/22 1816 84     Resp 05/31/22 1816 20     Temp 05/31/22 1816 98.1 F (36.7 C)     Temp Source 05/31/22 1816 Oral     SpO2 05/31/22 1816 94 %     Weight --      Height --      Head Circumference --      Peak Flow --      Pain Score 05/31/22 1812 8     Pain Loc --      Pain Edu? --      Excl. in [place]? --    No data found.  Updated Vital Signs BP 133/86 (BP Location: Left Arm) Comment (BP Location): large cuff  Pulse 84   Temp 98.1 F (36.7 C) (Oral)   Resp 20   SpO2 94%   Visual  Acuity Right Eye Distance:   Left Eye Distance:   Bilateral Distance:    Right Eye Near:   Left Eye Near:    Bilateral Near:     Physical Exam Vitals and nursing note reviewed.  Constitutional:      Appearance: Normal appearance.     Comments: Pleasant 47 year old female who appears stated age.  HENT:     Head: Normocephalic and atraumatic.     Right Ear: Tympanic membrane, ear canal and external ear normal. There is no impacted cerumen.     Left Ear: Tympanic membrane, ear canal and external ear normal. There is no impacted cerumen.     Nose: Nose normal.     Mouth/Throat:     Mouth: Mucous membranes are moist.     Pharynx: Uvula midline. Posterior oropharyngeal erythema present. No oropharyngeal exudate.     Tonsils: No tonsillar exudate or tonsillar abscesses.     Comments: Posterior pharynx erythema.  Uvula midline. Cardiovascular:     Rate and Rhythm: Normal rate and regular rhythm.     Pulses: Normal pulses.     Heart sounds: Normal heart sounds, S1 normal and S2 normal. No murmur heard. Pulmonary:     Effort: Pulmonary effort is normal. No respiratory distress.     Breath sounds: Normal breath sounds. No wheezing.     Comments: Lungs vesicular posteriorly. Lymphadenopathy:     Head:     Right side of head: Submental and submandibular adenopathy present.     Left side of head: Submental and submandibular adenopathy present.     Cervical: Cervical adenopathy present.  Skin:    General: Skin is warm and dry.  Neurological:     Mental Status: She is alert and oriented to person, place, and time.  Psychiatric:        Behavior: Behavior is cooperative.      UC Treatments / Results  Labs (all labs ordered are listed, but only abnormal results are displayed) Labs Reviewed - No data to display  EKG   Radiology No results found.  Procedures Procedures (including critical care time)  Medications Ordered in UC Medications - No data to display  Initial  Impression / Assessment and Plan / UC Course  I have reviewed the triage vital signs and the nursing notes.  Pertinent labs & imaging results that were available during my care of the patient were reviewed by me and considered in my medical decision making (see chart for details).  Patient tested positive for COVID-19 today and came to clinic seeking antiviral treatment.  Vital stable, lungs vesicular, able to speak in full sentences, no acute distress.  Will start on Paxlovid, patient can continue her BuSpar.  Advised to use albuterol inhaler as needed, Tessalon Perles as needed and symptomatic therapy.  Patient agreeable to plan.  Return precautions discussed.    Final Clinical Impressions(s) / UC Diagnoses   Final diagnoses:  Acute COVID-19  Acute cough     Discharge Instructions      You were found to be positive for COVID-19.  We have started you on Paxlovid.  You can continue your BuSpar, as these 2 do not interact.  Please use Tessalon Perles as needed for cough.  You can use the albuterol inhaler as needed for wheezing or shortness of breath.  You can take Tylenol and ibuprofen as needed for body aches, fevers, chills.  For sore throat you can do tea with warm honey and sleep with a humidifier.  Please follow-up with your primary care provider if your symptoms do not improve.  Please seek immediate care if you develop chest pain, shortness of breath, or fatigue.    ED Prescriptions     Medication Sig Dispense Auth. Provider   albuterol (VENTOLIN HFA) 108 (90 Base) MCG/ACT inhaler Inhale 1-2 puffs into the lungs every 6 (six) hours as needed for wheezing or shortness of breath. 18 g Louretta Shorten, [place], FNP   benzonatate (TESSALON) 100 MG capsule Take 1 capsule (100 mg total) by mouth every 8 (eight) hours. 21 capsule Louretta Shorten, [place], [place]   nirmatrelvir & ritonavir (PAXLOVID, 300/100,) 20 x 150 MG & 10 x 100MG  TBPK Take 3 tablets by mouth 2 (two) times daily for 5 days. 30  tablet Nacole Fluhr, [place], [place]      I have reviewed the PDMP during this encounter.   Shavaun Osterloh, [place] N, [place] 05/31/22 [phone]

## 2022-05-31 NOTE — ED Triage Notes (Addendum)
Patient took a at home covid test yesterday (expired) and it was positive.  Patient's husband is positive for covid.  05/26/2022 patient new she was not feeling well.  On 05/28/2022, patient had chills, cough, fever (100.1)   Patient has sob that is worsening.  Patient gets tight feeling when lying down.  Reports a lot  of drainage   Patient has had ibuprofen and tylenol

## 2022-09-06 ENCOUNTER — Ambulatory Visit (HOSPITAL_COMMUNITY)
Admission: EM | Admit: 2022-09-06 | Discharge: 2022-09-06 | Disposition: A | Discharge status: Home / Self Care | Payer: BC Managed Care – PPO | Attending: Internal Medicine | Admitting: Internal Medicine

## 2022-09-06 DIAGNOSIS — H109 Unspecified conjunctivitis: Secondary | ICD-10-CM | POA: Diagnosis not present

## 2022-09-06 MED ORDER — ERYTHROMYCIN 5 MG/GM OP OINT: TOPICAL_OINTMENT | OPHTHALMIC | 0 refills | Status: DC

## 2022-09-06 NOTE — Discharge Instructions (Signed)
I have prescribed an antibiotic medication that will go in the eye.  Please follow-up with eye doctor at provided contact information for further evaluation and management.

## 2022-09-06 NOTE — ED Provider Notes (Signed)
[facility]    CSN: [id number] Arrival date & time: 09/06/22  1025      History   Chief Complaint Chief Complaint  Patient presents with   Conjunctivitis    HPI Pamela Lewis is a 47 y.o. female.   Patient presents with left eye drainage that has been present for about 1 week.  Patient reports that both eyes had watery drainage and itchiness.  Then, left eye drainage became thicker and more purulent over the past few days.  She reports that she feels like there is a film over her eye but denies blurry vision.  She does not wear contacts but does wear glasses.  Denies trauma or foreign body to the eye.  Has taken allergy medication and used Visine with minimal improvement.   Conjunctivitis    Past Medical History:  Diagnosis Date   Anxiety    Migraine     Patient Active Problem List   Diagnosis Date Noted   Breast abscess of female 04/06/2013    Past Surgical History:  Procedure Laterality Date   BREAST REDUCTION SURGERY     IRRIGATION AND DEBRIDEMENT ABSCESS Right 04/06/2013   Procedure: IRRIGATION AND Drainage RIGHT BREAST ABSCESS;  Surgeon: Wilmon Arms. Corliss Skains, MD;  Location: [facility];  Service: General;  Laterality: Right;    OB History   No obstetric history on file.      Home Medications    Prior to Admission medications   Medication Sig Start Date End Date Taking? Authorizing Provider  erythromycin ophthalmic ointment Place a 1/2 inch ribbon of ointment into the lower eyelid 4 times daily for 7 days. 09/06/22  Yes Tillman Kazmierski, Acie Fredrickson, FNP  albuterol (VENTOLIN HFA) 108 (90 Base) MCG/ACT inhaler Inhale 1-2 puffs into the lungs every 6 (six) hours as needed for wheezing or shortness of breath. 03/21/22   Cathlyn Parsons, NP  albuterol (VENTOLIN HFA) 108 (90 Base) MCG/ACT inhaler Inhale 1-2 puffs into the lungs every 6 (six) hours as needed for wheezing or shortness of breath. 05/31/22   Garrison, [place] N, FNP  benzonatate (TESSALON) 100 MG capsule Take 1  capsule (100 mg total) by mouth every 8 (eight) hours. 05/31/22   Garrison, [place] N, FNP  ELDERBERRY PO Take by mouth.    [provider]  fluticasone (FLONASE) 50 MCG/ACT nasal spray Place 2 sprays into both nostrils daily. 03/05/20   Rodriguez-Southworth, Nettie Elm, PA-C  Ginger, Zingiber officinalis, (GINGER PO) Take 1 tablet by mouth daily.    [provider]  Multiple Vitamin (MULTIVITAMIN WITH MINERALS) TABS tablet Take 1 tablet by mouth daily.    [provider]  triamcinolone cream (KENALOG) 0.1 % Apply 1 application topically 2 (two) times daily as needed. Avoid use on the face 09/14/20   Particia Nearing, PA-C  TURMERIC PO Take 1 tablet by mouth daily.    [provider]    Family History Family History  Problem Relation Age of Onset   Diabetes Mother    Hypertension Mother     Social History Social History   Tobacco Use   Smoking status: Every Day   Smokeless tobacco: Never  Vaping Use   Vaping Use: Never used  Substance Use Topics   Alcohol use: Yes   Drug use: No     Allergies   Other   Review of Systems Review of Systems Per HPI  Physical Exam Triage Vital Signs ED Triage Vitals  Enc Vitals Group     BP  09/06/22 1200 (!) 149/95     Pulse Rate 09/06/22 1200 88     Resp 09/06/22 1200 18     Temp 09/06/22 1200 98.1 F (36.7 C)     Temp Source 09/06/22 1200 Oral     SpO2 09/06/22 1200 98 %     Weight --      Height --      Head Circumference --      Peak Flow --      Pain Score 09/06/22 1250 0     Pain Loc --      Pain Edu? --      Excl. in GC? --    No data found.  Updated Vital Signs BP (!) 149/95 (BP Location: Left Arm)   Pulse 88   Temp 98.1 F (36.7 C) (Oral)   Resp 18   SpO2 98%   Visual Acuity Right Eye Distance:   Left Eye Distance:   Bilateral Distance:    Right Eye Near:   Left Eye Near:    Bilateral Near:     Physical Exam Constitutional:      General: She is not in acute  distress.    Appearance: Normal appearance. She is not toxic-appearing or diaphoretic.  HENT:     Head: Normocephalic and atraumatic.  Eyes:     General: Lids are normal. Lids are everted, no foreign bodies appreciated. Vision grossly intact. Gaze aligned appropriately.     Extraocular Movements: Extraocular movements intact.     Conjunctiva/sclera:     Right eye: Right conjunctiva is not injected. No chemosis, exudate or hemorrhage.    Left eye: Left conjunctiva is injected. Exudate present. No chemosis or hemorrhage.    Pupils: Pupils are equal, round, and reactive to light.  Pulmonary:     Effort: Pulmonary effort is normal.  Neurological:     General: No focal deficit present.     Mental Status: She is alert and oriented to person, place, and time. Mental status is at baseline.  Psychiatric:        Mood and Affect: Mood normal.        Behavior: Behavior normal.        Thought Content: Thought content normal.        Judgment: Judgment normal.      UC Treatments / Results  Labs (all labs ordered are listed, but only abnormal results are displayed) Labs Reviewed - No data to display  EKG   Radiology No results found.  Procedures Procedures (including critical care time)  Medications Ordered in UC Medications - No data to display  Initial Impression / Assessment and Plan / UC Course  I have reviewed the triage vital signs and the nursing notes.  Pertinent labs & imaging results that were available during my care of the patient were reviewed by me and considered in my medical decision making (see chart for details).     Suspect possible left bacterial conjunctivitis given physical exam.  Other differentials include clogged tear duct but this is low suspicion given physical exam.  Will treat with erythromycin ointment.  Advised supportive care and following up with ophthalmology for further evaluation and management.  Provided patient with contact information for  ophthalmology.  Discussed strict return precautions.  Visual acuity appears normal.  Patient verbalized understanding and was agreeable with plan. Final Clinical Impressions(s) / UC Diagnoses   Final diagnoses:  Bacterial conjunctivitis of left eye     Discharge Instructions  I have prescribed an antibiotic medication that will go in the eye.  Please follow-up with eye doctor at provided contact information for further evaluation and management.    ED Prescriptions     Medication Sig Dispense Auth. Provider   erythromycin ophthalmic ointment Place a 1/2 inch ribbon of ointment into the lower eyelid 4 times daily for 7 days. 3.5 g Gustavus Bryant, [place]      PDMP not reviewed this encounter.   Gustavus Bryant, [place] 09/06/22 [phone]

## 2022-09-06 NOTE — ED Triage Notes (Signed)
Here for L-pink eye x 1 week. Denies any other symptoms at this time.

## 2023-05-22 ENCOUNTER — Ambulatory Visit (HOSPITAL_COMMUNITY)
Admission: EM | Admit: 2023-05-22 | Discharge: 2023-05-22 | Disposition: A | Discharge status: Home / Self Care | Payer: BC Managed Care – PPO | Attending: Internal Medicine | Admitting: Internal Medicine

## 2023-05-22 ENCOUNTER — Encounter (HOSPITAL_COMMUNITY): Payer: Self-pay

## 2023-05-22 DIAGNOSIS — L2084 Intrinsic (allergic) eczema: Secondary | ICD-10-CM

## 2023-05-22 DIAGNOSIS — R051 Acute cough: Secondary | ICD-10-CM

## 2023-05-22 DIAGNOSIS — R1032 Left lower quadrant pain: Secondary | ICD-10-CM | POA: Diagnosis not present

## 2023-05-22 HISTORY — DX: Bronchitis, not specified as acute or chronic: J40

## 2023-05-22 HISTORY — DX: Dermatitis, unspecified: L30.9

## 2023-05-22 LAB — POCT URINALYSIS DIP (MANUAL ENTRY)
Bilirubin, UA: NEGATIVE
Glucose, UA: NEGATIVE mg/dL
Ketones, POC UA: NEGATIVE mg/dL
Leukocytes, UA: NEGATIVE
Nitrite, UA: NEGATIVE
Protein Ur, POC: NEGATIVE mg/dL
Spec Grav, UA: 1.02 (ref 1.010–1.025)
Urobilinogen, UA: 1 U/dL
pH, UA: 6 (ref 5.0–8.0)

## 2023-05-22 MED ORDER — METHYLPREDNISOLONE SODIUM SUCC 125 MG IJ SOLR
80.0000 mg | Freq: Once | INTRAMUSCULAR | Status: AC
  Administered 2023-05-22: 80 mg via INTRAMUSCULAR

## 2023-05-22 MED ORDER — METHYLPREDNISOLONE SODIUM SUCC 125 MG IJ SOLR
INTRAMUSCULAR | Status: AC
  Filled 2023-05-22: qty 2

## 2023-05-22 MED ORDER — HYDROCORTISONE 2.5 % EX LOTN: TOPICAL_LOTION | Freq: Two times a day (BID) | CUTANEOUS | 2 refills | Status: AC | PRN

## 2023-05-22 MED ORDER — TRIAMCINOLONE ACETONIDE 0.1 % EX CREA: 1.0000 | TOPICAL_CREAM | Freq: Two times a day (BID) | CUTANEOUS | 3 refills | Status: AC | PRN

## 2023-05-22 MED ORDER — PREDNISONE 20 MG PO TABS: 40.0000 mg | ORAL_TABLET | Freq: Every day | ORAL | 0 refills | Status: AC

## 2023-05-22 MED ORDER — BENZONATATE 100 MG PO CAPS: 100.0000 mg | ORAL_CAPSULE | Freq: Three times a day (TID) | ORAL | 0 refills | Status: DC | PRN

## 2023-05-22 NOTE — Discharge Instructions (Addendum)
Cough with flare up of bronchitis. Rash most consistent with eczema flare. Abdominal pain is improving with only trace blood in the urine, this may be a resolving kidney stone. For now we treat with the following:  Medrol injection given today. This is a steroid to help with inflammation, swelling, pain, cough, itching and rash.  Benzonatate (tessalon) 100 mg every 8 hours as needed for cough.   Prednisone 40 mg (2 tablets) daily for 5 days. Take this in the morning. Do not take ibuprofen while on the steroid. Start this tomorrow 05/23/23 Triamcinolone cream twice daily to body/extremities as needed for itching/rash. Do not use this on the face or neck Hydrocortisone cream twice daily to the neck and face as needed for rash/itching. Return to urgent care or PCP if symptoms worsen or fail to resolve.

## 2023-05-22 NOTE — ED Triage Notes (Signed)
Patient has multiple complaints  Patient states she began having a cough x 7 days and was taking Homeopathic cough medication. Patient states she began having lip swelling and a rash, and "all over itching" x 6 days. Patient states she has been using Benadryl cream, Calamine lotion, and Hydrocortisone cream with no relief.  Patient states she also has had left loewr abdomen/groin pain and states it feels the same as when she had kidney stones. No problems with urination.

## 2023-05-22 NOTE — ED Provider Notes (Signed)
[facility]    CSN: [id number] Arrival date & time: 05/22/23  1004      History   Chief Complaint Chief Complaint  Patient presents with   Rash   Oral Swelling   Pruritis   Cough   Abdominal Pain    HPI Pamela Lewis is a 48 y.o. female.   48 y.o. female who presents to urgent care with complaints of 1. Cough for 7 days, 2. Body wide rash and itching with lip swelling, 3. LLQ pain with radiation to groin.  1. Cough: 7 days, works in dentistry, using OTC cough medication but not helping, denies fevers. Is having some congestion. Denies other symptoms. Has h/o bronchitis as well and using inhaler but not helping with cough, no shortness of breath.   2. Body wide rash/lip swelling/itching: started shortly after the cough, body/neck and getting worse, OTC eczema creams, Has h/o eczema but never this bad. No new medications or food but is starting a clean eating diet.  3. LLQ Pain with radiation to groin: started around same time as cough, some diarrhea, no dysuria, hematuria. Eating and drinking ok. Pain is starting to get better after she starting flushing out with water/fluids. H/o kidney stones. Denies difficulty urinating.    Rash Associated symptoms: abdominal pain   Associated symptoms: no fever, no joint pain, no shortness of breath, no sore throat and not vomiting   Cough Associated symptoms: rash   Associated symptoms: no chest pain, no chills, no ear pain, no fever, no shortness of breath and no sore throat   Abdominal Pain Associated symptoms: cough   Associated symptoms: no chest pain, no chills, no dysuria, no fever, no hematuria, no shortness of breath, no sore throat and no vomiting     Past Medical History:  Diagnosis Date   Anxiety    Bronchitis    Eczema    Migraine     Patient Active Problem List   Diagnosis Date Noted   Breast abscess of female 04/06/2013    Past Surgical History:  Procedure Laterality Date   BREAST REDUCTION  SURGERY     IRRIGATION AND DEBRIDEMENT ABSCESS Right 04/06/2013   Procedure: IRRIGATION AND Drainage RIGHT BREAST ABSCESS;  Surgeon: Wilmon Arms. Corliss Skains, MD;  Location: [facility];  Service: General;  Laterality: Right;    OB History   No obstetric history on file.      Home Medications    Prior to Admission medications   Medication Sig Start Date End Date Taking? Authorizing Provider  albuterol (VENTOLIN HFA) 108 (90 Base) MCG/ACT inhaler Inhale 1-2 puffs into the lungs every 6 (six) hours as needed for wheezing or shortness of breath. 03/21/22   Cathlyn Parsons, NP  albuterol (VENTOLIN HFA) 108 (90 Base) MCG/ACT inhaler Inhale 1-2 puffs into the lungs every 6 (six) hours as needed for wheezing or shortness of breath. 05/31/22   Garrison, [place] N, FNP  benzonatate (TESSALON) 100 MG capsule Take 1 capsule (100 mg total) by mouth every 8 (eight) hours. 05/31/22   Garrison, [place] N, FNP  ELDERBERRY PO Take by mouth.    [provider]  erythromycin ophthalmic ointment Place a 1/2 inch ribbon of ointment into the lower eyelid 4 times daily for 7 days. 09/06/22   Gustavus Bryant, FNP  fluticasone (FLONASE) 50 MCG/ACT nasal spray Place 2 sprays into both nostrils daily. 03/05/20   Rodriguez-Southworth, Nettie Elm, PA-C  Ginger, Zingiber officinalis, (GINGER PO) Take 1 tablet by mouth daily.  [provider]  Multiple Vitamin (MULTIVITAMIN WITH MINERALS) TABS tablet Take 1 tablet by mouth daily.    [provider]  triamcinolone cream (KENALOG) 0.1 % Apply 1 application topically 2 (two) times daily as needed. Avoid use on the face 09/14/20   Particia Nearing, PA-C  TURMERIC PO Take 1 tablet by mouth daily.    [provider]    Family History Family History  Problem Relation Age of Onset   Diabetes Mother    Hypertension Mother     Social History Social History   Tobacco Use   Smoking status: Every Day    Types: Cigarettes   Smokeless tobacco: Never   Vaping Use   Vaping status: Never Used  Substance Use Topics   Alcohol use: Yes   Drug use: No     Allergies   Other   Review of Systems Review of Systems  Constitutional:  Negative for chills and fever.  HENT:  Negative for ear pain and sore throat.   Eyes:  Negative for pain and visual disturbance.  Respiratory:  Positive for cough. Negative for shortness of breath.   Cardiovascular:  Negative for chest pain and palpitations.  Gastrointestinal:  Positive for abdominal pain. Negative for vomiting.  Genitourinary:  Negative for dysuria and hematuria.  Musculoskeletal:  Negative for arthralgias and back pain.  Skin:  Positive for rash. Negative for color change.       itching  Neurological:  Negative for seizures and syncope.  All other systems reviewed and are negative.    Physical Exam Triage Vital Signs ED Triage Vitals [05/22/23 1032]  Encounter Vitals Group     BP (!) 130/91     Systolic BP Percentile      Diastolic BP Percentile      Pulse Rate 90     Resp 16     Temp 98 F (36.7 C)     Temp Source Oral     SpO2 94 %     Weight      Height      Head Circumference      Peak Flow      Pain Score 1     Pain Loc      Pain Education      Exclude from Growth Chart    No data found.  Updated Vital Signs BP (!) 130/91 (BP Location: Right Arm)   Pulse 90   Temp 98 F (36.7 C) (Oral)   Resp 16   SpO2 94%   Visual Acuity Right Eye Distance:   Left Eye Distance:   Bilateral Distance:    Right Eye Near:   Left Eye Near:    Bilateral Near:     Physical Exam Vitals and nursing note reviewed.  Constitutional:      General: She is not in acute distress.    Appearance: She is well-developed.  HENT:     Head: Normocephalic and atraumatic.  Eyes:     Conjunctiva/sclera: Conjunctivae normal.  Cardiovascular:     Rate and Rhythm: Normal rate and regular rhythm.     Heart sounds: No murmur heard. Pulmonary:     Effort: Pulmonary effort is normal. No  respiratory distress.     Breath sounds: Normal breath sounds.  Abdominal:     Palpations: Abdomen is soft.     Tenderness: There is no abdominal tenderness.  Musculoskeletal:        General: No swelling.     Cervical back:  Neck supple.  Skin:    General: Skin is warm and dry.     Capillary Refill: Capillary refill takes less than 2 seconds.  Neurological:     Mental Status: She is alert.  Psychiatric:        Mood and Affect: Mood normal.      UC Treatments / Results  Labs (all labs ordered are listed, but only abnormal results are displayed) Labs Reviewed  POCT URINALYSIS DIP (MANUAL ENTRY)    EKG   Radiology No results found.  Procedures Procedures (including critical care time)  Medications Ordered in UC Medications - No data to display  Initial Impression / Assessment and Plan / UC Course  I have reviewed the triage vital signs and the nursing notes.  Pertinent labs & imaging results that were available during my care of the patient were reviewed by me and considered in my medical decision making (see chart for details).     Intrinsic eczema  Acute cough  LLQ abdominal pain   Cough with flare up of bronchitis, do not suspect flu/covid given no other significant symptoms or fevers. Rash most consistent with eczema flare. Abdominal pain is improving with only trace blood in the urine, this may be a resolving kidney stone. For now we treat with the following:  Medrol injection given today. This is a steroid to help with inflammation, swelling, pain, cough, itching and rash.  Benzonatate (tessalon) 100 mg every 8 hours as needed for cough.   Prednisone 40 mg (2 tablets) daily for 5 days. Take this in the morning. Do not take ibuprofen while on the steroid. Start this tomorrow 05/23/23 Triamcinolone cream twice daily to body/extremities as needed for itching/rash. Do not use this on the face or neck Hydrocortisone cream twice daily to the neck and face as needed  for rash/itching. Return to urgent care or PCP if symptoms worsen or fail to resolve.    Final Clinical Impressions(s) / UC Diagnoses   Final diagnoses:  None   Discharge Instructions   None    ED Prescriptions   None    PDMP not reviewed this encounter.   Landis Martins, [place] 05/22/23 1114

## 2023-09-12 ENCOUNTER — Other Ambulatory Visit: Payer: Self-pay

## 2023-09-12 ENCOUNTER — Other Ambulatory Visit (HOSPITAL_COMMUNITY): Payer: Self-pay

## 2023-09-12 MED ORDER — WEGOVY 0.25 MG/0.5ML ~~LOC~~ SOAJ
0.2500 mg | SUBCUTANEOUS | 0 refills | Status: DC
  Filled 2023-09-12 – 2023-10-07 (×6): qty 2, 28d supply, fill #0

## 2023-09-12 MED ORDER — FLUCONAZOLE 200 MG PO TABS
200.0000 mg | ORAL_TABLET | Freq: Once | ORAL | 0 refills | Status: AC
  Filled 2023-09-12: qty 1, 1d supply, fill #0

## 2023-09-16 ENCOUNTER — Other Ambulatory Visit (HOSPITAL_COMMUNITY): Payer: Self-pay

## 2023-09-20 ENCOUNTER — Other Ambulatory Visit (HOSPITAL_COMMUNITY): Payer: Self-pay

## 2023-09-22 ENCOUNTER — Other Ambulatory Visit (HOSPITAL_COMMUNITY): Payer: Self-pay

## 2023-09-30 ENCOUNTER — Other Ambulatory Visit (HOSPITAL_COMMUNITY): Payer: Self-pay

## 2023-10-07 ENCOUNTER — Other Ambulatory Visit (HOSPITAL_COMMUNITY): Payer: Self-pay

## 2024-01-25 ENCOUNTER — Ambulatory Visit (HOSPITAL_COMMUNITY)
Admission: EM | Admit: 2024-01-25 | Discharge: 2024-01-25 | Disposition: A | Discharge status: Home / Self Care | Attending: Family Medicine | Admitting: Family Medicine

## 2024-01-25 ENCOUNTER — Encounter (HOSPITAL_COMMUNITY): Payer: Self-pay

## 2024-01-25 ENCOUNTER — Ambulatory Visit (INDEPENDENT_AMBULATORY_CARE_PROVIDER_SITE_OTHER)

## 2024-01-25 DIAGNOSIS — R0602 Shortness of breath: Secondary | ICD-10-CM

## 2024-01-25 DIAGNOSIS — R062 Wheezing: Secondary | ICD-10-CM | POA: Diagnosis not present

## 2024-01-25 DIAGNOSIS — J069 Acute upper respiratory infection, unspecified: Secondary | ICD-10-CM | POA: Diagnosis not present

## 2024-01-25 LAB — POC COVID19/FLU A&B COMBO
Covid Antigen, POC: NEGATIVE
Influenza A Antigen, POC: NEGATIVE
Influenza B Antigen, POC: NEGATIVE

## 2024-01-25 MED ORDER — ALBUTEROL SULFATE HFA 108 (90 BASE) MCG/ACT IN AERS: 1.0000 | INHALATION_SPRAY | Freq: Four times a day (QID) | RESPIRATORY_TRACT | 1 refills | Status: AC | PRN

## 2024-01-25 MED ORDER — PREDNISONE 20 MG PO TABS: 40.0000 mg | ORAL_TABLET | Freq: Every day | ORAL | 0 refills | Status: AC

## 2024-01-25 MED ORDER — BENZONATATE 100 MG PO CAPS: ORAL_CAPSULE | ORAL | 0 refills | Status: AC

## 2024-01-25 NOTE — ED Triage Notes (Signed)
 Patient here today with c/o cough, nasal congestion, SOB, PND, and wheeze since Sunday. Patient has been taking Zinc, Ginger, Honey Tea, Tylenol , and Ibuprofen  with some relief. Patient has a h/o bronchitis and uses an inhaler. Patient's husband also started getting sick on Sunday.

## 2024-01-25 NOTE — ED Provider Notes (Signed)
 [facility]   [id number] 01/25/24 Arrival Time: 0802  ASSESSMENT & PLAN:  1. SOB (shortness of breath)   2. Viral URI with cough   3. Wheezing    I have personally viewed and independently interpreted the imaging studies ordered this visit. CXR: no acute changes.  Discussed typical duration of likely viral illness with wheezing. Without resp distress. Results for orders placed or performed during the hospital encounter of 01/25/24  POC Covid19/Flu A&B Antigen   Collection Time: 01/25/24  8:52 AM  Result Value Ref Range   Influenza A Antigen, POC Negative Negative   Influenza B Antigen, POC Negative Negative   Covid Antigen, POC Negative Negative   OTC symptom care as needed.  Meds ordered this encounter  Medications   albuterol  (VENTOLIN  HFA) 108 (90 Base) MCG/ACT inhaler    Sig: Inhale 1-2 puffs into the lungs every 6 (six) hours as needed for wheezing or shortness of breath.    Dispense:  1 each    Refill:  1   benzonatate  (TESSALON ) 100 MG capsule    Sig: Take 1 capsule by mouth every 8 (eight) hours for cough.    Dispense:  21 capsule    Refill:  0   predniSONE  (DELTASONE ) 20 MG tablet    Sig: Take 2 tablets (40 mg total) by mouth daily.    Dispense:  10 tablet    Refill:  0     Follow-up Information     [facility], [facility].   Why: As needed. Contact information: [address] [place] [address] [phone]                 Reviewed expectations re: course of current medical issues. Questions answered. Outlined signs and symptoms indicating need for more acute intervention. Understanding verbalized. After Visit Summary given.   SUBJECTIVE: History from: Patient. Pamela Lewis is a 48 y.o. female. Patient here today with c/o cough, nasal congestion, SOB, PND, and wheeze since Sunday. Patient has been taking Zinc, Ginger, Honey Tea, Tylenol , and Ibuprofen  with some relief. Patient has a h/o bronchitis and uses an  inhaler. Patient's husband also started getting sick on Sunday.   Denies: fever. Normal PO intake without n/v/d.  OBJECTIVE:  Vitals:   01/25/24 0821  BP: (!) 145/93  Pulse: 89  Resp: 16  Temp: 98.5 F (36.9 C)  TempSrc: Oral  SpO2: 97%    General appearance: alert; no distress Eyes: PERRLA; EOMI; conjunctiva normal HENT: [place]; AT; with nasal congestion Neck: supple  Lungs: speaks full sentences without difficulty; unlabored; mild bilat wheezing Extremities: no edema Skin: warm and dry Neurologic: normal gait Psychological: alert and cooperative; normal mood and affect  Labs: Results for orders placed or performed during the hospital encounter of 01/25/24  POC Covid19/Flu A&B Antigen   Collection Time: 01/25/24  8:52 AM  Result Value Ref Range   Influenza A Antigen, POC Negative Negative   Influenza B Antigen, POC Negative Negative   Covid Antigen, POC Negative Negative   Labs Reviewed  POC COVID19/FLU A&B COMBO    Imaging: DG Chest 2 View Result Date: 01/25/2024 CLINICAL DATA:  Shortness of breath with cough and nasal congestion as well as wheezing 5 days. EXAM: CHEST - 2 VIEW COMPARISON:  03/05/2020 FINDINGS: Lungs are adequately inflated without lobar consolidation or effusion. Cardiomediastinal silhouette is unchanged. Prominent overlying soft tissues. Mild degenerative change of the spine. IMPRESSION: No acute cardiopulmonary disease. Electronically Signed   By: Toribio Agreste M.D.  On: 01/25/2024 08:58    Allergies  Allergen Reactions   Other Hives, Shortness Of Breath and Swelling    Seafood Fruits with skin. Cats    Past Medical History:  Diagnosis Date   Anxiety    Bronchitis    Eczema    Migraine    Social History   Socioeconomic History   Marital status: Married    Spouse name: Not on file   Number of children: Not on file   Years of education: Not on file   Highest education level: Not on file  Occupational History   Not on file  Tobacco  Use   Smoking status: Some Days    Types: Cigarettes   Smokeless tobacco: Never  Vaping Use   Vaping status: Never Used  Substance and Sexual Activity   Alcohol use: Yes   Drug use: No   Sexual activity: Not on file  Other Topics Concern   Not on file  Social History Narrative   Not on file   Social Drivers of Health   Financial Resource Strain: Not on file  Food Insecurity: Not on file  Transportation Needs: Not on file  Physical Activity: Not on file  Stress: Not on file  Social Connections: Not on file  Intimate Partner Violence: Not on file   Family History  Problem Relation Age of Onset   Diabetes Mother    Hypertension Mother    Past Surgical History:  Procedure Laterality Date   BREAST REDUCTION SURGERY     IRRIGATION AND DEBRIDEMENT ABSCESS Right 04/06/2013   Procedure: IRRIGATION AND Drainage RIGHT BREAST ABSCESS;  Surgeon: Donnice POUR. Belinda, MD;  Location: [facility];  Service: General;  Laterality: Right;     Rolinda Rogue, MD 01/25/24 1014
